# Patient Record
Sex: Female | Born: 1990 | Race: White | Hispanic: No | Smoking: Never smoker
Health system: Southern US, Community
[De-identification: ages and names within clinical notes are randomized; demographics above are authoritative.]

## PROBLEM LIST (undated history)

## (undated) DIAGNOSIS — T7840XA Allergy, unspecified, initial encounter: Secondary | ICD-10-CM

## (undated) DIAGNOSIS — Z973 Presence of spectacles and contact lenses: Secondary | ICD-10-CM

## (undated) DIAGNOSIS — F32A Depression, unspecified: Secondary | ICD-10-CM

## (undated) DIAGNOSIS — F419 Anxiety disorder, unspecified: Secondary | ICD-10-CM

## (undated) DIAGNOSIS — J353 Hypertrophy of tonsils with hypertrophy of adenoids: Secondary | ICD-10-CM

## (undated) HISTORY — DX: Allergy, unspecified, initial encounter: T78.40XA

## (undated) HISTORY — PX: NO PAST SURGERIES: SHX2092

## (undated) HISTORY — PX: OTHER SURGICAL HISTORY: SHX169

## (undated) HISTORY — DX: Anxiety disorder, unspecified: F41.9

## (undated) HISTORY — DX: Depression, unspecified: F32.A

## (undated) HISTORY — PX: WISDOM TOOTH EXTRACTION: SHX21

---
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
## 2012-02-26 ENCOUNTER — Ambulatory Visit: Payer: Self-pay

## 2012-10-08 ENCOUNTER — Ambulatory Visit: Payer: Self-pay | Admitting: Internal Medicine

## 2016-02-03 ENCOUNTER — Encounter: Payer: Self-pay | Admitting: Family Medicine

## 2016-02-03 ENCOUNTER — Ambulatory Visit (INDEPENDENT_AMBULATORY_CARE_PROVIDER_SITE_OTHER): Payer: BLUE CROSS/BLUE SHIELD | Admitting: Family Medicine

## 2016-02-03 VITALS — BP 109/68 | HR 67 | Temp 98.0°F | Resp 16 | Ht 67.0 in | Wt 172.0 lb

## 2016-02-03 DIAGNOSIS — J329 Chronic sinusitis, unspecified: Secondary | ICD-10-CM | POA: Diagnosis not present

## 2016-02-03 DIAGNOSIS — R0982 Postnasal drip: Secondary | ICD-10-CM

## 2016-02-03 DIAGNOSIS — J301 Allergic rhinitis due to pollen: Secondary | ICD-10-CM | POA: Diagnosis not present

## 2016-02-03 DIAGNOSIS — J029 Acute pharyngitis, unspecified: Secondary | ICD-10-CM | POA: Diagnosis not present

## 2016-02-03 LAB — POCT RAPID STREP A (OFFICE): Rapid Strep A Screen: NEGATIVE

## 2016-02-03 MED ORDER — PSEUDOEPHEDRINE HCL 60 MG PO TABS: 60.0000 mg | ORAL_TABLET | Freq: Three times a day (TID) | ORAL | Status: DC | PRN

## 2016-02-03 MED ORDER — LORATADINE 10 MG PO TABS: 10.0000 mg | ORAL_TABLET | Freq: Every day | ORAL | Status: DC

## 2016-02-03 MED ORDER — OXYMETAZOLINE HCL 0.05 % NA SOLN: NASAL | Status: DC

## 2016-02-03 MED ORDER — FLUTICASONE PROPIONATE 50 MCG/ACT NA SUSP
2.0000 | Freq: Every day | NASAL | Status: DC
Start: 2016-02-03 — End: 2016-03-09

## 2016-02-03 NOTE — Progress Notes (Signed)
Subjective:    Patient ID: Karen Sanders, female    DOB: 07/01/1991, 25 y.o.   MRN: [id number]  HPI: Karen Sanders is a 25 y.o. female presenting on 02/03/2016 for Sore Throat and Laryngitis   HPI  Pt presents for sore thorat x 2 weeks. Started with cold symptoms- congestion, sore throat, cough. Still having ear pain, hoarse voice. Lots of sinus drainage. No sinus pain or pressure. Has history of sinus issues. Takes zyrtec daily. Gets strep throat 3 times per year. She is requesting an ENT referral today.   Past Medical History  Diagnosis Date  . Allergy     No current outpatient prescriptions on file prior to visit.   No current facility-administered medications on file prior to visit.    Review of Systems  Constitutional: Negative for fever and chills.  HENT: Positive for congestion, postnasal drip, rhinorrhea, sore throat and voice change. Negative for ear pain, sinus pressure, sneezing and trouble swallowing.   Respiratory: Positive for cough. Negative for chest tightness, shortness of breath and wheezing.   Cardiovascular: Negative for chest pain, palpitations and leg swelling.  Gastrointestinal: Negative.  Negative for nausea, vomiting and diarrhea.  Musculoskeletal: Negative for neck pain and neck stiffness.  Neurological: Negative for headaches.   Per HPI unless specifically indicated above     Objective:    BP 109/68 mmHg  Pulse 67  Temp(Src) 98 F (36.7 C) (Oral)  Resp 16  Ht  (1.702 m)  Wt 172 lb (78.019 kg)  BMI 26.93 kg/m2  SpO2 99%  LMP   Wt Readings from Last 3 Encounters:  02/03/16 172 lb (78.019 kg)    Physical Exam  Constitutional: She appears well-developed and well-nourished. No distress.  HENT:  Head: Normocephalic and atraumatic.  Right Ear: Hearing normal. Tympanic membrane is retracted. Tympanic membrane is not erythematous and not bulging.  Left Ear: Hearing normal. Tympanic membrane is scarred. Tympanic membrane is not  erythematous and not bulging.  Nose: Mucosal edema and rhinorrhea present. No sinus tenderness or nasal septal hematoma. Right sinus exhibits no maxillary sinus tenderness and no frontal sinus tenderness. Left sinus exhibits no maxillary sinus tenderness and no frontal sinus tenderness.  Mouth/Throat: Uvula is midline and mucous membranes are normal. No uvula swelling. Posterior oropharyngeal erythema (mild. No beefy redness or petechaie.) present. No posterior oropharyngeal edema.  Neck: Neck supple. No Brudzinski's sign and no Kernig's sign noted.  Cardiovascular: Normal rate, regular rhythm and normal heart sounds.   Pulmonary/Chest: Breath sounds normal. No accessory muscle usage. No tachypnea. No respiratory distress.  Lymphadenopathy:    She has no cervical adenopathy.   Results for orders placed or performed in visit on 02/03/16  POCT rapid strep A  Result Value Ref Range   Rapid Strep A Screen Negative Negative      Assessment & Plan:   Problem List Items Addressed This Visit    None    Visit Diagnoses    Sore throat    -  Primary    Likely 2/2 nasal drainage. Salt water gargles for comfort. Rapid strep is negative. Culture pending. Referral to ENT placed for frequent strep and sinus issues.    Relevant Orders    POCT rapid strep A (Completed)    Culture, Group A Strep    Post-nasal drainage        Start flonase and claritin. Start sudafed to help with congestion. Afrin as needed for ear congestion.     Relevant  Medications    pseudoephedrine (SUDAFED) 60 MG tablet    oxymetazoline (AFRIN NASAL SPRAY) 0.05 % nasal spray    Other Relevant Orders    Ambulatory referral to ENT    Allergic rhinitis due to pollen        Start flonase daily to help with symptoms. Pt is requesting and ENT referral due to frequent sinus issues.     Relevant Medications    fluticasone (FLONASE) 50 MCG/ACT nasal spray    loratadine (CLARITIN) 10 MG tablet    Other Relevant Orders    Ambulatory  referral to ENT       Meds ordered this encounter  Medications  . fluticasone (FLONASE) 50 MCG/ACT nasal spray    Sig: Place 2 sprays into both nostrils daily.    Dispense:  16 g    Refill:  11    Order Specific Question:  Supervising Provider    Answer:  Janeann Forehand [phone]  . pseudoephedrine (SUDAFED) 60 MG tablet    Sig: Take 1 tablet (60 mg total) by mouth every 8 (eight) hours as needed for congestion.    Dispense:  30 tablet    Refill:  0    Order Specific Question:  Supervising Provider    Answer:  Janeann Forehand [phone]  . oxymetazoline (AFRIN NASAL SPRAY) 0.05 % nasal spray    Sig: Place 2 sprays in the nose at bedtime for 3 days and 3 days only. Lean head back and allow to drip into the ear.    Dispense:  30 mL    Refill:  0    Order Specific Question:  Supervising Provider    Answer:  Janeann Forehand [[id number]]  . loratadine (CLARITIN) 10 MG tablet    Sig: Take 1 tablet (10 mg total) by mouth daily.    Dispense:  30 tablet    Refill:  11    Order Specific Question:  Supervising Provider    Answer:  Janeann Forehand [phone]      Follow up plan: Return if symptoms worsen or fail to improve.

## 2016-02-03 NOTE — Patient Instructions (Signed)
I think your symptoms are related to sinus drainage. Start taking Flonase 2 sprays in the AM daily. For congestion take sudafed at needed. You can also use Afrin at bedtime to help with the ear pain.    Take a claritin or allegra over the counter daily.  We will have you see an ENT to discuss having your tonsils out and your persistent symptoms.

## 2016-02-06 LAB — CULTURE, GROUP A STREP: Strep A Culture: NEGATIVE

## 2016-02-15 ENCOUNTER — Ambulatory Visit: Payer: BLUE CROSS/BLUE SHIELD | Admitting: Family Medicine

## 2016-02-15 ENCOUNTER — Encounter: Payer: Self-pay | Admitting: Family Medicine

## 2016-03-09 ENCOUNTER — Encounter: Payer: Self-pay | Admitting: *Deleted

## 2016-03-14 NOTE — Discharge Instructions (Signed)
T & A INSTRUCTION SHEET - [facility] °[facility] ° °CREIGHTON VAUGHT, MD °PAUL H. JUENGEL, MD  °P. [facility] BENNETT °CHAPMAN MCQUEEN, MD ° °[address] [place], [place] [address] TEL. [phone] °[address] [place] [address] [phone] ° °INFORMATION SHEET FOR A TONSILLECTOMY AND ADENDOIDECTOMY ° °About Your Tonsils and Adenoids ° The tonsils and adenoids are normal body tissues that are part of our immune system.  They normally help to protect us against diseases that may enter our mouth and nose.  However, sometimes the tonsils and/or adenoids become too large and obstruct our breathing, especially at night. °  ° If either of these things happen it helps to remove the tonsils and adenoids in order to become healthier. The operation to remove the tonsils and adenoids is called a tonsillectomy and adenoidectomy. ° °The Location of Your Tonsils and Adenoids ° The tonsils are located in the back of the throat on both side and sit in a cradle of muscles. The adenoids are located in the roof of the mouth, behind the nose, and closely associated with the opening of the Eustachian tube to the ear. ° °Surgery on Tonsils and Adenoids ° A tonsillectomy and adenoidectomy is a short operation which takes about thirty minutes.  This includes being put to sleep and being awakened.  Tonsillectomies and adenoidectomies are performed at [facility] and may require observation period in the recovery room prior to going home. ° °Following the Operation for a Tonsillectomy ° A cautery machine is used to control bleeding.  Bleeding from a tonsillectomy and adenoidectomy is minimal and postoperatively the risk of bleeding is approximately four percent, although this rarely life threatening. ° ° ° °After your tonsillectomy and adenoidectomy post-op care at home: ° °1. Our patients are able to go home the same day.  You may be given prescriptions for pain  medications and antibiotics, if indicated. °2. It is extremely important to remember that fluid intake is of utmost importance after a tonsillectomy.  The amount that you drink must be maintained in the postoperative period.  A good indication of whether a child is getting enough fluid is whether his/her urine output is constant.  As long as children are urinating or wetting their diaper every 6 - 8 hours this is usually enough fluid intake.   °3. Although rare, this is a risk of some bleeding in the first ten days after surgery.  This is usually occurs between day five and nine postoperatively.  This risk of bleeding is approximately four percent.  If you or your child should have any bleeding you should remain calm and notify our office or go directly to the Emergency Room at [facility] where they will contact us. Our doctors are available seven days a week for notification.  We recommend sitting up quietly in a chair, place an ice pack on the front of the neck and spitting out the blood gently until we are able to contact you.  Adults should gargle gently with ice water and this may help stop the bleeding.  If the bleeding does not stop after a short time, i.e. 10 to 15 minutes, or seems to be increasing again, please contact us or go to the hospital.   °4. It is common for the pain to be worse at 5 - 7 days postoperatively.  This occurs because the “scab” is peeling off and the mucous membrane (skin of   the throat) is growing back where the tonsils were.   °5. It is common for a low-grade fever, less than 102, during the first week after a tonsillectomy and adenoidectomy.  It is usually due to not drinking enough liquids, and we suggest your use liquid Tylenol or the pain medicine with Tylenol prescribed in order to keep your temperature below 102.  Please follow the directions on the back of the bottle. °6. Do not take aspirin or any products that contain aspirin such as Bufferin, Anacin,  Ecotrin, aspirin gum, Goodies, BC headache powders, etc., after a T&A because it can promote bleeding.  Please check with our office before administering any other medication that may been prescribed by other doctors during the two week post-operative period. °7. If you happen to look in the mirror or into your child’s mouth you will see white/gray patches on the back of the throat.  This is what a scab looks like in the mouth and is normal after having a T&A.  It will disappear once the tonsil area heals completely. However, it may cause a noticeable odor, and this too will disappear with time.     °8. You or your child may experience ear pain after having a T&A.  This is called referred pain and comes from the throat, but it is felt in the ears.  Ear pain is quite common and expected.  It will usually go away after ten days.  There is usually nothing wrong with the ears, and it is primarily due to the healing area stimulating the nerve to the ear that runs along the side of the throat.  Use either the prescribed pain medicine or Tylenol as needed.  °9. The throat tissues after a tonsillectomy are obviously sensitive.  Smoking around children who have had a tonsillectomy significantly increases the risk of bleeding.  DO NOT SMOKE!  ° °General Anesthesia, Adult, Care After °Refer to this sheet in the next few weeks. These instructions provide you with information on caring for yourself after your procedure. Your health care provider may also give you more specific instructions. Your treatment has been planned according to current medical practices, but problems sometimes occur. Call your health care provider if you have any problems or questions after your procedure. °WHAT TO EXPECT AFTER THE PROCEDURE °After the procedure, it is typical to experience: °· Sleepiness. °· Nausea and vomiting. °HOME CARE INSTRUCTIONS °· For the first 24 hours after general anesthesia: °¨ Have a responsible person with you. °¨ Do not  drive a car. If you are alone, do not take public transportation. °¨ Do not drink alcohol. °¨ Do not take medicine that has not been prescribed by your health care provider. °¨ Do not sign important papers or make important decisions. °¨ You may resume a normal diet and activities as directed by your health care provider. °· Change bandages (dressings) as directed. °· If you have questions or problems that seem related to general anesthesia, call the hospital and ask for the anesthetist or anesthesiologist on call. °SEEK MEDICAL CARE IF: °· You have nausea and vomiting that continue the day after anesthesia. °· You develop a rash. °SEEK IMMEDIATE MEDICAL CARE IF:  °· You have difficulty breathing. °· You have chest pain. °· You have any allergic problems. °  °This information is not intended to replace advice given to you by your health care provider. Make sure you discuss any questions you have with your health care provider. °  °Document   Released: 02/26/2001 Document Revised: 12/11/2014 Document Reviewed: 03/20/2012 °Elsevier Interactive Patient Education ©2016 [employer] ° °

## 2016-03-15 ENCOUNTER — Ambulatory Visit: Payer: BLUE CROSS/BLUE SHIELD | Admitting: Anesthesiology

## 2016-03-15 ENCOUNTER — Ambulatory Visit
Admission: RE | Admit: 2016-03-15 | Discharge: 2016-03-15 | Disposition: A | Discharge status: Home / Self Care | Payer: BLUE CROSS/BLUE SHIELD | Source: Ambulatory Visit | Attending: Otolaryngology | Admitting: Otolaryngology

## 2016-03-15 ENCOUNTER — Encounter
Admission: RE | Disposition: A | Discharge status: Home / Self Care | Payer: Self-pay | Source: Ambulatory Visit | Attending: Otolaryngology

## 2016-03-15 ENCOUNTER — Encounter: Payer: Self-pay | Admitting: Otolaryngology

## 2016-03-15 DIAGNOSIS — Z881 Allergy status to other antibiotic agents status: Secondary | ICD-10-CM | POA: Insufficient documentation

## 2016-03-15 DIAGNOSIS — J3489 Other specified disorders of nose and nasal sinuses: Secondary | ICD-10-CM | POA: Diagnosis not present

## 2016-03-15 DIAGNOSIS — J353 Hypertrophy of tonsils with hypertrophy of adenoids: Secondary | ICD-10-CM | POA: Diagnosis not present

## 2016-03-15 DIAGNOSIS — J351 Hypertrophy of tonsils: Secondary | ICD-10-CM | POA: Diagnosis not present

## 2016-03-15 HISTORY — DX: Presence of spectacles and contact lenses: Z97.3

## 2016-03-15 HISTORY — PX: TONSILLECTOMY AND ADENOIDECTOMY: SHX28

## 2016-03-15 HISTORY — DX: Hypertrophy of tonsils with hypertrophy of adenoids: J35.3

## 2016-03-15 SURGERY — TONSILLECTOMY AND ADENOIDECTOMY
Anesthesia: General | Site: Throat | Wound class: Clean Contaminated

## 2016-03-15 MED ORDER — GLYCOPYRROLATE 0.2 MG/ML IJ SOLN
INTRAMUSCULAR | Status: DC | PRN
  Administered 2016-03-15: 0.1 mg via INTRAVENOUS

## 2016-03-15 MED ORDER — LIDOCAINE VISCOUS 2 % MT SOLN: 10.0000 mL | Freq: Four times a day (QID) | OROMUCOSAL | Status: DC | PRN

## 2016-03-15 MED ORDER — FENTANYL CITRATE (PF) 100 MCG/2ML IJ SOLN
25.0000 ug | INTRAMUSCULAR | Status: DC | PRN
  Administered 2016-03-15: 25 ug via INTRAVENOUS

## 2016-03-15 MED ORDER — DEXAMETHASONE SODIUM PHOSPHATE 4 MG/ML IJ SOLN
INTRAMUSCULAR | Status: DC | PRN
  Administered 2016-03-15: 10 mg via INTRAVENOUS

## 2016-03-15 MED ORDER — PROPOFOL 10 MG/ML IV BOLUS
INTRAVENOUS | Status: DC | PRN
  Administered 2016-03-15: 160 mg via INTRAVENOUS

## 2016-03-15 MED ORDER — LIDOCAINE HCL (CARDIAC) 20 MG/ML IV SOLN
INTRAVENOUS | Status: DC | PRN
  Administered 2016-03-15: 40 mg via INTRAVENOUS

## 2016-03-15 MED ORDER — PROMETHAZINE HCL 12.5 MG PO TABS: 12.5000 mg | ORAL_TABLET | Freq: Four times a day (QID) | ORAL | Status: DC | PRN

## 2016-03-15 MED ORDER — FENTANYL CITRATE (PF) 100 MCG/2ML IJ SOLN
INTRAMUSCULAR | Status: DC | PRN
  Administered 2016-03-15: 100 ug via INTRAVENOUS
  Administered 2016-03-15 (×4): 25 ug via INTRAVENOUS

## 2016-03-15 MED ORDER — FENTANYL CITRATE (PF) 100 MCG/2ML IJ SOLN: 25.0000 ug | INTRAMUSCULAR | Status: DC | PRN

## 2016-03-15 MED ORDER — OXYCODONE HCL 5 MG/5ML PO SOLN: 10.0000 mg | ORAL | Status: DC | PRN

## 2016-03-15 MED ORDER — ACETAMINOPHEN 10 MG/ML IV SOLN
1000.0000 mg | Freq: Once | INTRAVENOUS | Status: AC
  Administered 2016-03-15: 1000 mg via INTRAVENOUS

## 2016-03-15 MED ORDER — ONDANSETRON HCL 4 MG/2ML IJ SOLN: 4.0000 mg | Freq: Once | INTRAMUSCULAR | Status: DC | PRN

## 2016-03-15 MED ORDER — OXYMETAZOLINE HCL 0.05 % NA SOLN
NASAL | Status: DC | PRN
  Administered 2016-03-15: 1 via TOPICAL

## 2016-03-15 MED ORDER — LACTATED RINGERS IV SOLN
INTRAVENOUS | Status: DC
  Administered 2016-03-15: 10 mL/h via INTRAVENOUS

## 2016-03-15 MED ORDER — MIDAZOLAM HCL 5 MG/5ML IJ SOLN
INTRAMUSCULAR | Status: DC | PRN
  Administered 2016-03-15: 2 mg via INTRAVENOUS

## 2016-03-15 MED ORDER — OXYCODONE HCL 5 MG/5ML PO SOLN
5.0000 mg | Freq: Once | ORAL | Status: AC
  Administered 2016-03-15: 5 mg via ORAL

## 2016-03-15 MED ORDER — BUPIVACAINE HCL (PF) 0.25 % IJ SOLN
INTRAMUSCULAR | Status: DC | PRN
  Administered 2016-03-15: 2 mL

## 2016-03-15 MED ORDER — ONDANSETRON HCL 4 MG/2ML IJ SOLN
INTRAMUSCULAR | Status: DC | PRN
  Administered 2016-03-15: 4 mg via INTRAVENOUS

## 2016-03-15 MED ORDER — SUCCINYLCHOLINE CHLORIDE 20 MG/ML IJ SOLN
INTRAMUSCULAR | Status: DC | PRN
  Administered 2016-03-15: 100 mg via INTRAVENOUS

## 2016-03-15 SURGICAL SUPPLY — 17 items
BLADE BOVIE TIP EXT 4 (BLADE) ×2 IMPLANT
CANISTER SUCT 1200ML W/VALVE (MISCELLANEOUS) ×2 IMPLANT
CATH ROBINSON RED A/P 10FR (CATHETERS) ×2 IMPLANT
COAG SUCT 10F 3.5MM HAND CTRL (MISCELLANEOUS) ×2 IMPLANT
GLOVE BIO SURGEON STRL SZ7.5 (GLOVE) ×2 IMPLANT
HANDLE SUCTION POOLE (INSTRUMENTS) ×1 IMPLANT
KIT ROOM TURNOVER OR (KITS) ×2 IMPLANT
NEEDLE HYPO 25GX1X1/2 BEV (NEEDLE) ×2 IMPLANT
NS IRRIG 500ML POUR BTL (IV SOLUTION) ×2 IMPLANT
PACK TONSIL/ADENOIDS (PACKS) ×2 IMPLANT
PAD GROUND ADULT SPLIT (MISCELLANEOUS) ×2 IMPLANT
PENCIL ELECTRO HAND CTR (MISCELLANEOUS) ×2 IMPLANT
SOL ANTI-FOG 6CC FOG-OUT (MISCELLANEOUS) ×1 IMPLANT
SOL FOG-OUT ANTI-FOG 6CC (MISCELLANEOUS) ×1
STRAP BODY AND KNEE 60X3 (MISCELLANEOUS) ×2 IMPLANT
SUCTION POOLE HANDLE (INSTRUMENTS) ×2
SYR 5ML LL (SYRINGE) ×2 IMPLANT

## 2016-03-15 NOTE — Anesthesia Procedure Notes (Signed)
Procedure Name: Intubation Date/Time: 03/15/2016 9:36 AM Performed by: Jimmy PicketAMYOT, Jaylean Buenaventura Pre-anesthesia Checklist: Patient identified, Emergency Drugs available, Suction available, Patient being monitored and Timeout performed Patient Re-evaluated:Patient Re-evaluated prior to inductionOxygen Delivery Method: Circle system utilized Preoxygenation: Pre-oxygenation with 100% oxygen Intubation Type: IV induction Ventilation: Mask ventilation without difficulty Laryngoscope Size: Miller and 2 Grade View: Grade I Tube type: Oral Rae Tube size: 7.0 mm Number of attempts: 1 Placement Confirmation: ETT inserted through vocal cords under direct vision,  positive ETCO2 and breath sounds checked- equal and bilateral Tube secured with: Tape Dental Injury: Teeth and Oropharynx as per pre-operative assessment

## 2016-03-15 NOTE — H&P (Signed)
..  History and Physical paper copy reviewed and updated date of procedure and will be scanned into system.  

## 2016-03-15 NOTE — Transfer of Care (Signed)
Immediate Anesthesia Transfer of Care Note  Patient: Karen Sanders  Procedure(s) Performed: Procedure(s): TONSILLECTOMY AND ADENOIDECTOMY (N/A)  Patient Location: PACU  Anesthesia Type: General ETT  Level of Consciousness: awake, alert  and patient cooperative  Airway and Oxygen Therapy: Patient Spontanous Breathing and Patient connected to supplemental oxygen  Post-op Assessment: Post-op Vital signs reviewed, Patient's Cardiovascular Status Stable, Respiratory Function Stable, Patent Airway and No signs of Nausea or vomiting  Post-op Vital Signs: Reviewed and stable  Complications: No apparent anesthesia complications  

## 2016-03-15 NOTE — Anesthesia Postprocedure Evaluation (Signed)
Anesthesia Post Note  Patient: Karen Sanders  Procedure(s) Performed: Procedure(s) (LRB): TONSILLECTOMY AND ADENOIDECTOMY (N/A)  Patient location during evaluation: PACU Anesthesia Type: General Level of consciousness: awake and alert and oriented Pain management: satisfactory to patient Vital Signs Assessment: post-procedure vital signs reviewed and stable Respiratory status: spontaneous breathing, nonlabored ventilation and respiratory function stable Cardiovascular status: blood pressure returned to baseline and stable Postop Assessment: Adequate PO intake and No signs of nausea or vomiting Anesthetic complications: no    Cherly BeachStella, Shalandria Elsbernd J

## 2016-03-15 NOTE — Transfer of Care (Deleted)
Immediate Anesthesia Transfer of Care Note  Patient: Karen Sanders  Procedure(s) Performed: Procedure(s): TONSILLECTOMY AND ADENOIDECTOMY (N/A)  Patient Location: PACU  Anesthesia Type: General ETT  Level of Consciousness: awake, alert  and patient cooperative  Airway and Oxygen Therapy: Patient Spontanous Breathing and Patient connected to supplemental oxygen  Post-op Assessment: Post-op Vital signs reviewed, Patient's Cardiovascular Status Stable, Respiratory Function Stable, Patent Airway and No signs of Nausea or vomiting  Post-op Vital Signs: Reviewed and stable  Complications: No apparent anesthesia complications

## 2016-03-15 NOTE — Anesthesia Preprocedure Evaluation (Addendum)
Anesthesia Evaluation  Patient identified by MRN, date of birth, ID band  Reviewed: Allergy & Precautions, H&P , NPO status , Patient's Chart, lab work & pertinent test results  Airway Mallampati: I  TM Distance: >3 FB Neck ROM: full    Dental no notable dental hx.    Pulmonary    Pulmonary exam normal        Cardiovascular  Rhythm:regular Rate:Normal     Neuro/Psych    GI/Hepatic   Endo/Other    Renal/GU      Musculoskeletal   Abdominal   Peds  Hematology   Anesthesia Other Findings   Reproductive/Obstetrics                             Anesthesia Physical Anesthesia Plan  ASA: I  Anesthesia Plan: General ETT   Post-op Pain Management:    Induction:   Airway Management Planned:   Additional Equipment:   Intra-op Plan:   Post-operative Plan:   Informed Consent: I have reviewed the patients History and Physical, chart, labs and discussed the procedure including the risks, benefits and alternatives for the proposed anesthesia with the patient or authorized representative who has indicated his/her understanding and acceptance.     Plan Discussed with: CRNA  Anesthesia Plan Comments:         Anesthesia Quick Evaluation  

## 2016-03-15 NOTE — Op Note (Signed)
..  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  General endotracheal anesthesia was induced in the normal fashion.  At this time, the patient was rotated 45 degrees and a shoulder roll was placed.  At this time, a McIvor mouthgag was inserted into the patient's oral cavity and suspended from the Mayo stand without injury to teeth, lips, or gums.  Next a red rubber catheter was inserted into the patient left nostril for retraction of the uvula and soft palate superiorly.  Next a curved Alice clamp was attached to the patient's right superior tonsillar pole and retracted medially and inferiorly.  A Bovie electrocautery was used to dissect the patient's right tonsil in a subcapsular plane.  Meticulous hemostasis was achieved with Bovie suction cautery.  At this time, the mouth gag was released from suspension for 1 minute.  Attention now was directed to the patient's left side.  In a similar fashion the curved Alice clamp was attached to the superior pole and this was retracted medially and inferiorly and the tonsil was excised in a subcapsular plane with Bovie electrocautery.  After completion of the second tonsil, meticulous hemostasis was continued.  At this time, attention was directed to the patient's Adenoidectomy.  Under indirect visualization using an operating mirror, the adenoid tissue was visualized and noted to be obstructive in nature.  Using a [place]  Claire forceps, the adenoid tissue was de bulked and debrided for a widely patent choana.  Folling debulking, the remaining adenoid tissue was ablated and desiccated with Bovie suction cautery.  Meticulous hemostasis was continued.  At this time, the patient's nasal cavity and oral cavity was irrigated with sterile saline.  2cc of 0.5% Marcaine was injected into the anterior and posterior tonsillar fossa bilaterally.  Following this  The care of patient was returned to anesthesia, awakened, and transferred to recovery in stable condition.  Dispo:  PACU to home  Plan: Soft diet.  Limit exercise and strenuous activity for 2 weeks.  Fluid hydration  Recheck my office three weeks.   Mikenzi Raysor 10:08 AM 03/15/2016

## 2016-03-16 ENCOUNTER — Encounter: Payer: Self-pay | Admitting: Otolaryngology

## 2016-03-17 LAB — SURGICAL PATHOLOGY

## 2016-10-09 ENCOUNTER — Ambulatory Visit
Admission: EM | Admit: 2016-10-09 | Discharge: 2016-10-09 | Disposition: A | Discharge status: Home / Self Care | Payer: BLUE CROSS/BLUE SHIELD | Attending: Family Medicine | Admitting: Family Medicine

## 2016-10-09 DIAGNOSIS — J069 Acute upper respiratory infection, unspecified: Secondary | ICD-10-CM | POA: Diagnosis not present

## 2016-10-09 LAB — RAPID STREP SCREEN (MED CTR MEBANE ONLY): Streptococcus, Group A Screen (Direct): NEGATIVE

## 2016-10-09 MED ORDER — FLUTICASONE PROPIONATE 50 MCG/ACT NA SUSP: 2.0000 | Freq: Every day | NASAL | 0 refills | Status: DC

## 2016-10-09 MED ORDER — CETIRIZINE-PSEUDOEPHEDRINE ER 5-120 MG PO TB12
1.0000 | ORAL_TABLET | Freq: Two times a day (BID) | ORAL | 0 refills | Status: DC
Start: 2016-10-09 — End: 2017-05-04

## 2016-10-09 NOTE — ED Provider Notes (Signed)
CSN: [id number]     Arrival date & time 10/09/16  0857 History   First MD Initiated Contact with Patient 10/09/16 1035     Chief Complaint  Patient presents with  . Fatigue   (Consider location/radiation/quality/duration/timing/severity/associated sxs/prior Treatment) HPI  This a 25 year old female who presents with the sudden onset of fatigue sore throat chills fever to 100.4 and body aches. So during the earlier part of the day she was feeling fine but around noon time began to have the sudden onset of the above symptoms. Since that time she states that today she is more congested than anything having a greenish discharge from her nose. Not had any coughing to speak of. She is afebrile today but took Tylenol Prior to Her Visit. She Did Not Have a Flu Shot This Year so Far.      Past Medical History:  Diagnosis Date  . Allergy   . Hypertrophy of tonsils and adenoids   . Wears contact lenses    Past Surgical History:  Procedure Laterality Date  . NO PAST SURGERIES    . TONSILLECTOMY AND ADENOIDECTOMY N/A 03/15/2016   Procedure: TONSILLECTOMY AND ADENOIDECTOMY;  Surgeon: Bud Facereighton Vaught, MD;  [facility];  Service: ENT;  Laterality: N/A;   Family History  Problem Relation Age of Onset  . Heart disease Mother    Social History  Substance Use Topics  . Smoking status: Never Smoker  . Smokeless tobacco: Never Used  . Alcohol use No   OB History    No data available     Review of Systems  Constitutional: Positive for activity change, fatigue and fever. Negative for appetite change and chills.  HENT: Positive for congestion, postnasal drip, rhinorrhea, sinus pressure and sore throat.   Respiratory: Negative for cough, shortness of breath, wheezing and stridor.   All other systems reviewed and are negative.   Allergies  Amoxicillin  Home Medications   Prior to Admission medications   Medication Sig Start Date End Date Taking? Authorizing Provider   cetirizine-pseudoephedrine (ZYRTEC-D) 5-120 MG tablet Take 1 tablet by mouth 2 (two) times daily. 10/09/16   Lutricia FeilWilliam P Carmel Waddington, PA-C  fluticasone (FLONASE) 50 MCG/ACT nasal spray Place 2 sprays into both nostrils daily. 10/09/16   Lutricia FeilWilliam P Saliha Salts, PA-C   Meds Ordered and Administered this Visit  Medications - No data to display  BP 94/71 (BP Location: Left Arm)   Pulse (!) 103   Temp 98.5 F (36.9 C) (Oral)   Resp 16   Ht 5\' 5"  (1.651 m)   Wt 180 lb (81.6 kg)   SpO2 98%   BMI 29.95 kg/m  No data found.   Physical Exam  Constitutional: She is oriented to person, place, and time. She appears well-developed and well-nourished. No distress.  HENT:  Head: Normocephalic and atraumatic.  Right Ear: External ear normal.  Mouth/Throat: Oropharynx is clear and moist.  Left ear has sclerosis at approximately the 5:00 to 7:00 position. nostril, mucosa is boggy bilaterally.  Eyes: EOM are normal. Pupils are equal, round, and reactive to light. Right eye exhibits no discharge. Left eye exhibits no discharge.  Neck: Normal range of motion. Neck supple.  Pulmonary/Chest: Effort normal and breath sounds normal. No respiratory distress. She has no wheezes. She has no rales.  Musculoskeletal: Normal range of motion.  Neurological: She is alert and oriented to person, place, and time.  Skin: Skin is warm and dry. She is not diaphoretic.  Psychiatric: She has a normal  mood and affect. Her behavior is normal. Judgment and thought content normal.  Nursing note and vitals reviewed.   Urgent Care Course   Clinical Course     Procedures (including critical care time)  Labs Review Labs Reviewed  RAPID STREP SCREEN (NOT AT [facility])  CULTURE, GROUP A STREP [facility])    Imaging Review No results found.   Visual Acuity Review  Right Eye Distance:   Left Eye Distance:   Bilateral Distance:    Right Eye Near:   Left Eye Near:    Bilateral Near:         MDM   1. URI, acute    Discharge  Medication List as of 10/09/2016 10:35 AM    START taking these medications   Details  cetirizine-pseudoephedrine (ZYRTEC-D) 5-120 MG tablet Take 1 tablet by mouth 2 (two) times daily., Starting Mon 10/09/2016, Normal    fluticasone (FLONASE) 50 MCG/ACT nasal spray Place 2 sprays into both nostrils daily., Starting Mon 10/09/2016, Normal      Plan: 1. Test/x-ray results and diagnosis reviewed with patient 2. rx as per orders; risks, benefits, potential side effects reviewed with patient 3. Recommend supportive treatment with Rest and fluid. His Tylenol or Motrin for fatigue and fever control. If she is not improving in a week or 2 she should return to the clinic. This is most likely a viral infection and does not require antibiotic therapy. Cultures and sensitivities from the pharyngeal swab will be available 48 hours and we will treat her according to the results.  4. F/u prn if symptoms worsen or don't improve     Lutricia FeilWilliam P Ether Wolters, PA-C 10/09/16 1043

## 2016-10-09 NOTE — ED Triage Notes (Signed)
Patient complains of fatigue, sore throat, chills, fever, body aches that started suddenly yesterday. Patient states that she felt fine before 12pm yesterday.

## 2016-10-10 LAB — CULTURE, GROUP A STREP (THRC)

## 2016-10-11 ENCOUNTER — Telehealth: Payer: Self-pay | Admitting: Emergency Medicine

## 2016-10-11 MED ORDER — AZITHROMYCIN 250 MG PO TABS: ORAL_TABLET | ORAL | 0 refills | Status: DC

## 2016-10-11 NOTE — Telephone Encounter (Signed)
Patient notified that her throat culture result came back positive for strep.  Patient notified that a [facility] [place] for [place] to start today.  Patient verbalized understanding.

## 2016-11-20 DIAGNOSIS — J029 Acute pharyngitis, unspecified: Secondary | ICD-10-CM | POA: Diagnosis not present

## 2017-01-16 DIAGNOSIS — L853 Xerosis cutis: Secondary | ICD-10-CM | POA: Diagnosis not present

## 2017-05-04 ENCOUNTER — Ambulatory Visit (INDEPENDENT_AMBULATORY_CARE_PROVIDER_SITE_OTHER): Payer: BLUE CROSS/BLUE SHIELD | Admitting: Certified Nurse Midwife

## 2017-05-04 ENCOUNTER — Encounter: Payer: Self-pay | Admitting: Certified Nurse Midwife

## 2017-05-04 VITALS — BP 121/82 | HR 70 | Ht 65.0 in | Wt 181.5 lb

## 2017-05-04 DIAGNOSIS — R102 Pelvic and perineal pain: Secondary | ICD-10-CM

## 2017-05-04 NOTE — Patient Instructions (Signed)

## 2017-05-04 NOTE — Progress Notes (Signed)
GYN ENCOUNTER NOTE  Subjective:       Karen Sanders is a 26 y.o. G0P0000 female is here for gynecologic evaluation of the following issues:  1. Vaginal  odor with menstration, pain with intercourse and irregular bleeding.  She states that her bleeding became more irregular in January. She has bleeding monthly that spans from 3 days duration to 3 weeks with the amount decreasing to spotting. She states that she has an odor only with her menses and that the blood is "fleshy" with possible tissue present. She also states that she has pain with intercourse. Nothing makes the pain better or worse but she has been avoiding intercourse due to pain.  She denies burning , itching , or fever. She is currently sexually active with one female partner for the past 3 yrs. She uses condoms every time because she "does not want any kids".    Gynecologic History Patient's last menstrual period was 04/23/2017 (exact date). Contraception: Nexplanon Last Pap: not had one  Last mammogram: N/A  Obstetric History OB History  Gravida Para Term Preterm AB Living  0 0 0 0 0 0  SAB TAB Ectopic Multiple Live Births  0 0 0 0 0        Past Medical History:  Diagnosis Date  . Allergy   . Hypertrophy of tonsils and adenoids   . Wears contact lenses     Past Surgical History:  Procedure Laterality Date  . NO PAST SURGERIES    . TONSILLECTOMY AND ADENOIDECTOMY N/A 03/15/2016   Procedure: TONSILLECTOMY AND ADENOIDECTOMY;  Surgeon: Bud Facereighton Vaught, MD;  [facility];  Service: ENT;  Laterality: N/A;    No current outpatient prescriptions on file prior to visit.   No current facility-administered medications on file prior to visit.     Allergies  Allergen Reactions  . Amoxicillin Swelling    Social History   Social History  . Marital status: Single    Spouse name: N/A  . Number of children: N/A  . Years of education: N/A   Occupational History  . Not on file.   Social History  Main Topics  . Smoking status: Never Smoker  . Smokeless tobacco: Never Used  . Alcohol use No  . Drug use: No  . Sexual activity: Yes    Birth control/ protection: Implant, Condom   Other Topics Concern  . Not on file   Social History Narrative  . No narrative on file    Family History  Problem Relation Age of Onset  . Heart disease Mother   . Breast cancer Neg Hx   . Ovarian cancer Neg Hx   . Colon cancer Neg Hx   . Diabetes Neg Hx     The following portions of the patient's history were reviewed and updated as appropriate: allergies, current medications, past family history, past medical history, past social history, past surgical history and problem list.  Review of Systems Review of Systems - General ROS: negative for - chills, fatigue, fever, hot flashes, malaise or night sweats Hematological and Lymphatic ROS: negative for - bleeding problems or swollen lymph nodes Gastrointestinal ROS: negative for - abdominal pain, blood in stools, change in bowel habits and nausea/vomiting Musculoskeletal ROS: negative for - joint pain, muscle pain or muscular weakness Genito-Urinary ROS: negative for -  Dysmenorrhea, dysuria, genital discharge, genital ulcers, hematuria, incontinence nocturia or pelvic pain. Positive : dyspareunia, irregular/heavy menses, painful intercourse 5/10 on pain scale  Objective:   BP  121/82   Pulse 70   Ht 5\' 5"  (1.651 m)   Wt 181 lb 8 oz (82.3 kg)   LMP 04/23/2017 (Exact Date)   BMI 30.20 kg/m  CONSTITUTIONAL: Well-developed, well-nourished female in no acute distress.  HENT:  Normocephalic, atraumatic.  NECK: Normal range of motion, supple  SKIN: Skin is warm and dry. No rash noted. Not diaphoretic. No erythema. No pallor. NEUROLGIC: Alert and oriented to person, place, and time.  PSYCHIATRIC: Normal mood and affect. Normal behavior. Normal judgment and thought content. CARDIOVASCULAR:Not Examined RESPIRATORY: Not Examined BREASTS: Not  Examined ABDOMEN: Soft, non distended; Non tender. PELVIC:  External Genitalia: Normal  BUS: Normal  Vagina: Normal, white frothy discharge noted, no odor pesent  Cervix: Normal  Uterus: Normal size, shape,consistency, mobile  Adnexa: Normal  RV: Normal   Bladder: Nontender MUSCULOSKELETAL: Normal range of motion. No tenderness.  No cyanosis, clubbing, or edema.  Assessment:   1.Vaginal odor  - NuSwab Vaginitis Plus (VG+) 2. Painful intercourse 3. Irregular peroids     Plan:   NuSwab , transvaginal ultrasound. Discussed possible causes of pain during intercourse. Pt states that the pain is similar to the feeling she had with palpation of the cervix during exam. Encouraged pt to try different sexual positions. Pain possibly due to cervical stimulation. Will follow up with results of testing. Nexplanon side effect reviewed regarding irregular bleeding. She is due to have it removed in September. Discussed BC options. She is interested in having the Mirena IUD. Reviewed risks, benefits, and procedure placement. We developed a plan of care. Encouraged her to come in for an annual exam to have a pap smear completed at her earliest convienience.  I attest that more than 50% of this visit was spent reviewing patient history, discussing plan of care, treatment options, and  review of birth control methods.   Doreene Burke, CNM

## 2017-05-08 ENCOUNTER — Encounter: Payer: Self-pay | Admitting: Certified Nurse Midwife

## 2017-05-08 ENCOUNTER — Other Ambulatory Visit: Payer: Self-pay | Admitting: Certified Nurse Midwife

## 2017-05-08 LAB — NUSWAB VAGINITIS PLUS (VG+)
Atopobium vaginae: HIGH Score — AB
BVAB 2: HIGH Score — AB
Candida albicans, NAA: POSITIVE — AB
Candida glabrata, NAA: NEGATIVE
Chlamydia trachomatis, NAA: NEGATIVE
Megasphaera 1: HIGH Score — AB
Neisseria gonorrhoeae, NAA: NEGATIVE
Trich vag by NAA: NEGATIVE

## 2017-05-08 MED ORDER — METRONIDAZOLE 500 MG PO TABS: 500.0000 mg | ORAL_TABLET | Freq: Two times a day (BID) | ORAL | 0 refills | Status: AC

## 2017-05-08 MED ORDER — TIOCONAZOLE 6.5 % VA OINT: 1.0000 | TOPICAL_OINTMENT | Freq: Once | VAGINAL | 0 refills | Status: AC

## 2017-05-08 NOTE — Progress Notes (Signed)
Nuswab results positive for BV and yeast. Pt notified via my chart.  Doreene BurkeAnnie Dax Murguia, CNM

## 2017-05-24 ENCOUNTER — Other Ambulatory Visit: Payer: Self-pay | Admitting: Certified Nurse Midwife

## 2017-05-24 DIAGNOSIS — N939 Abnormal uterine and vaginal bleeding, unspecified: Secondary | ICD-10-CM

## 2017-06-04 ENCOUNTER — Ambulatory Visit (INDEPENDENT_AMBULATORY_CARE_PROVIDER_SITE_OTHER): Payer: BLUE CROSS/BLUE SHIELD

## 2017-06-04 DIAGNOSIS — N939 Abnormal uterine and vaginal bleeding, unspecified: Secondary | ICD-10-CM

## 2017-06-15 ENCOUNTER — Telehealth: Payer: Self-pay | Admitting: Certified Nurse Midwife

## 2017-06-15 NOTE — Telephone Encounter (Signed)
Patient called wanting to speak with Pattricia Bossnnie, wanting to know her U/S results and what may have been seen. Patient would like to also know what she needs to do as far as moving forward with scheduling. Please advise.

## 2017-06-29 ENCOUNTER — Encounter: Payer: BLUE CROSS/BLUE SHIELD | Admitting: Certified Nurse Midwife

## 2017-07-13 ENCOUNTER — Encounter: Payer: Self-pay | Admitting: Certified Nurse Midwife

## 2017-07-13 ENCOUNTER — Ambulatory Visit (INDEPENDENT_AMBULATORY_CARE_PROVIDER_SITE_OTHER): Payer: BLUE CROSS/BLUE SHIELD | Admitting: Certified Nurse Midwife

## 2017-07-13 VITALS — BP 104/72 | HR 75 | Ht 64.0 in | Wt 184.2 lb

## 2017-07-13 DIAGNOSIS — Z0000 Encounter for general adult medical examination without abnormal findings: Secondary | ICD-10-CM

## 2017-07-13 NOTE — Progress Notes (Signed)
Pt was scheduled for a pap smear today but started her period today. States it is a normal flow.

## 2017-07-24 DIAGNOSIS — L298 Other pruritus: Secondary | ICD-10-CM | POA: Diagnosis not present

## 2017-08-03 ENCOUNTER — Ambulatory Visit (INDEPENDENT_AMBULATORY_CARE_PROVIDER_SITE_OTHER): Payer: BLUE CROSS/BLUE SHIELD | Admitting: Nurse Practitioner

## 2017-08-03 ENCOUNTER — Encounter: Payer: Self-pay | Admitting: Nurse Practitioner

## 2017-08-03 VITALS — BP 106/71 | HR 81 | Temp 98.3°F | Ht 62.5 in | Wt 187.6 lb

## 2017-08-03 DIAGNOSIS — Z23 Encounter for immunization: Secondary | ICD-10-CM | POA: Diagnosis not present

## 2017-08-03 DIAGNOSIS — Z7189 Other specified counseling: Secondary | ICD-10-CM | POA: Diagnosis not present

## 2017-08-03 DIAGNOSIS — Z7185 Encounter for immunization safety counseling: Secondary | ICD-10-CM

## 2017-08-03 MED ORDER — VARICELLA VIRUS VACCINE LIVE 1350 PFU/0.5ML IJ SUSR: 0.5000 mL | Freq: Once | INTRAMUSCULAR | 0 refills | Status: AC

## 2017-08-03 MED ORDER — TETANUS-DIPHTH-ACELL PERTUSSIS 5-2.5-18.5 LF-MCG/0.5 IM SUSP: 0.5000 mL | Freq: Once | INTRAMUSCULAR | Status: DC

## 2017-08-03 NOTE — Progress Notes (Signed)
Subjective:    Patient ID: Karen Sanders, female    DOB: [birth year], 26 y.o.   MRN: [id number]  Karen Sanders is a 26 y.o. 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  States is fearful of needles and wants to spread them out.  Pt presents requirements.  Review of NCIR shows pt will need: NEEDS tetanus: Last DTP/aP in 2009  NEEDS varicella: only 1 of 2 administered last dose  NEEDS influenza: 04/07/1998   Pt requests general counseling for hepatitis A vaccine and HPV vaccines.  Pt has had previous sexual partners and has already had potential exposure to HPV virus.  Social History  Substance Use Topics  . Smoking status: Never Smoker  . Smokeless tobacco: Never Used  . Alcohol use No    Review of Systems Per HPI unless specifically indicated above     Objective:    BP 106/71 (BP Location: Right Arm, Patient Position: Sitting, Cuff Size: Normal)   Pulse 81   Temp 98.3 F (36.8 C) (Oral)   Ht 5' 2.5" (1.588 m)   Wt 187 lb 9.6 oz (85.1 kg)   LMP 07/13/2017 (Exact Date)   BMI 33.77 kg/m   Wt Readings from Last 3 Encounters:  08/03/17 187 lb 9.6 oz (85.1 kg)  07/13/17 184 lb 3 oz (83.5 kg)  05/04/17 181 lb 8 oz (82.3 kg)    Physical Exam  Constitutional: She is oriented to person, place, and time. She appears well-developed and well-nourished.  HENT:  Head: Normocephalic and atraumatic.  Cardiovascular: Normal rate, regular rhythm and normal heart sounds.   Pulmonary/Chest: Effort normal and breath sounds normal.  Neurological: She is alert and oriented to person, place, and time.  Skin: Skin is warm and dry.  Psychiatric: She has a normal mood and affect. Her behavior is normal. Judgment and thought content normal.  Vitals reviewed.     Results for orders placed or performed  in visit on 05/04/17  NuSwab Vaginitis Plus (VG+)  Result Value Ref Range   Atopobium vaginae High - 2 (A) Score   BVAB 2 High - 2 (A) Score   Megasphaera 1 High - 2 (A) Score   Candida albicans, NAA Positive (A) Negative   Candida glabrata, NAA Negative Negative   Trich vag by NAA Negative Negative   Chlamydia trachomatis, NAA Negative Negative   Neisseria gonorrhoeae, NAA Negative Negative      Assessment & Plan:   Problem List Items Addressed This Visit    None    Visit Diagnoses    Vaccine counseling    -  Primary Review of NCIR reveals deficits in HPV, Hep A, varicella vaccines as recommended.  Pt has questions about need for HPV and Hep A.  Plan: 1. Discussed diseases, exposures, history of possible prior exposure, utility of vaccination, risk of disease.  - pt decides to decline hepatitis A r/t low risk for exposure  - pt decides to decline HPV for now r/t prior sexual encounters w/ possible transmission and no utility of vaccination if prior exposure.  Discussed cervical cancer risk, screening tests and intervals. Will discuss vaccination further with OBGYN at appointment in about 3 weeks. 2. Needs ppd prior to clinical which is > 1 year away - defer today.    Need for diphtheria-tetanus-pertussis (Tdap) vaccine  Due 2009.  Administer today.   Relevant Orders   Tdap vaccine greater than or equal to 7yo IM (Completed)   Influenza vaccine needed     Administer today.   Relevant Orders   Flu Vaccine QUAD 6+ mos PF IM (Fluarix Quad PF) (Completed)   Need for varicella vaccine     Varicella - needs 1 more vaccine for total of 2.  Pt declines titer today.  Plan: 1. Rx sent to commercial pharmacy.  Pt can pick up and bring immediately to clinic for administration or have administered at pharmacy.   Relevant Medications   varicella virus vaccine live (VARIVAX) 1350 PFU/0.5ML INJ injection      Meds ordered this encounter  Medications  . DISCONTD: Tdap (BOOSTRIX)  injection 0.5 mL  . varicella virus vaccine live (VARIVAX) 1350 PFU/0.5ML INJ injection    Sig: Inject 0.5 mLs into the skin once.    Dispense:  0.5 mL    Refill:  0      Follow up plan: Return in about 1 week (around 08/10/2017) for varicella administration w/ CMA.  A total of 20 minutes was spent face-to-face with this patient. Greater than 50% of this time was spent in counseling and coordination of care with the patient about vaccines and general prevention of disease as above.   Wilhelmina Mcardle, DNP, AGPCNP-BC Adult Gerontology Primary Care Nurse Practitioner [facility] 08/03/2017, 3:53 PM

## 2017-08-03 NOTE — Progress Notes (Signed)
I have reviewed this encounter including the documentation in this note and/or discussed this patient with the provider, Wilhelmina McardleLauren Kennedy, AGPCNP-BC. I am certifying that I agree with the content of this note as supervising physician.  Saralyn PilarAlexander Maikel Neisler, DO [facility] 08/03/2017, 8:44 PM

## 2017-08-03 NOTE — Patient Instructions (Addendum)
Lory, Thank you for coming in to clinic today.  1. 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  2. Varicella: order sent your pharmacy [facility] [address] [place].  Pick it up on your way to the clinic for it to be given.  3. Flu shot and Tetanus shot today.   Please schedule a follow-up appointment. Return in about 1 week (around 08/10/2017) for varicella administration w/ CMA.  Pick up from pharmacy on your way to clinic.  If you have any other questions or concerns, please feel free to call the clinic or send a message through MyChart. You may also schedule an earlier appointment if necessary.  You will receive a survey after today's visit either digitally by e-mail or paper by [employer] mail. Your experiences and feedback matter to us.  Please respond so we know how we are doing as we provide care for you.   Wilhelmina McardleLauren Tyeisha Dinan, DNP, AGNP-BC Adult Gerontology Nurse Practitioner [facility], [facility]

## 2017-08-10 ENCOUNTER — Other Ambulatory Visit: Payer: Self-pay

## 2017-08-10 ENCOUNTER — Ambulatory Visit: Payer: BLUE CROSS/BLUE SHIELD

## 2017-08-10 DIAGNOSIS — Z23 Encounter for immunization: Secondary | ICD-10-CM

## 2017-08-10 MED ORDER — VARICELLA VIRUS VACCINE LIVE 1350 PFU/0.5ML IJ SUSR: 0.5000 mL | Freq: Once | INTRAMUSCULAR | 0 refills | Status: AC

## 2017-08-17 ENCOUNTER — Encounter: Payer: BLUE CROSS/BLUE SHIELD | Admitting: Certified Nurse Midwife

## 2017-09-07 ENCOUNTER — Ambulatory Visit (INDEPENDENT_AMBULATORY_CARE_PROVIDER_SITE_OTHER): Payer: BLUE CROSS/BLUE SHIELD | Admitting: Certified Nurse Midwife

## 2017-09-07 ENCOUNTER — Encounter: Payer: Self-pay | Admitting: Certified Nurse Midwife

## 2017-09-07 VITALS — BP 108/79 | HR 77 | Wt 185.5 lb

## 2017-09-07 DIAGNOSIS — Z3046 Encounter for surveillance of implantable subdermal contraceptive: Secondary | ICD-10-CM

## 2017-09-07 DIAGNOSIS — Z30017 Encounter for initial prescription of implantable subdermal contraceptive: Secondary | ICD-10-CM | POA: Diagnosis not present

## 2017-09-07 NOTE — Patient Instructions (Signed)
Nexplanon Instructions After Insertion  Keep bandage clean and dry for 24 hours  May use ice/Tylenol/Ibuprofen for soreness or pain  If you develop fever, drainage or increased warmth from incision site-contact office immediately   

## 2017-09-07 NOTE — Progress Notes (Signed)
Karen Sanders is a 26 y.o. year old G63P0000 Caucasian female here for Nexplanon removal and reinsertion.  She was given informed consent for removal and reinsertion of her Nexplanon. Her Nexplanon was placed 3 yrs ago, Patient's last menstrual period was 08/27/2017 (exact date)., and her pregnancy test was not indicated.   Risks/benefits/side effects of Nexplanon have been discussed and her questions have been answered.  Specifically, a failure rate of 12/998 has been reported, with an increased failure rate if pt takes St. John's Wort and/or antiseizure medicaitons.  [name] is aware of the common side effect of irregular bleeding, which the incidence of decreases over time.  BP 108/79   Pulse 77   Wt 185 lb 8 oz (84.1 kg)   LMP 08/27/2017 (Exact Date)   BMI 33.39 kg/m  Patient's last menstrual period was 08/27/2017 (exact date). No results found for this or any previous visit (from the past 24 hour(s)).   Appropriate time out taken. Nexplanon site identified.  Area prepped in usual sterile fashon. Two cc's of 2% lidocaine was used to anesthetize the area. A small stab incision was made right beside the implant on the distal portion.  The Nexplanon rod was grasped using hemostats and removed intact without difficulty.  The area was cleansed again with betadine and the Nexplanon was inserted per manufacturer's recommendations without difficulty.  Steri-strips and a pressure bandage was applied.  There was less than 3 cc blood loss. There were no complications.  The patient tolerated the procedure well.  She was instructed to keep the area clean and dry, remove pressure bandage in 24 hours, and keep insertion site covered with the steri-strips for 3-5 days.  She was given a card indicating date Nexplanon was inserted and date it needs to be removed.   Follow-up PRN problems.  Doreene Burke, CNM

## 2017-09-07 NOTE — Progress Notes (Signed)
Pt is here to have Nexplanon removed. Would like to have it replaced.

## 2017-10-05 ENCOUNTER — Encounter: Payer: BLUE CROSS/BLUE SHIELD | Admitting: Nurse Practitioner

## 2017-10-12 ENCOUNTER — Other Ambulatory Visit: Payer: Self-pay

## 2017-10-12 ENCOUNTER — Encounter: Payer: Self-pay | Admitting: Nurse Practitioner

## 2017-10-12 ENCOUNTER — Other Ambulatory Visit: Payer: Self-pay | Admitting: Nurse Practitioner

## 2017-10-12 ENCOUNTER — Ambulatory Visit (INDEPENDENT_AMBULATORY_CARE_PROVIDER_SITE_OTHER): Payer: BLUE CROSS/BLUE SHIELD | Admitting: Nurse Practitioner

## 2017-10-12 VITALS — BP 112/68 | HR 70 | Temp 98.2°F | Ht 62.5 in | Wt 175.8 lb

## 2017-10-12 DIAGNOSIS — Z0000 Encounter for general adult medical examination without abnormal findings: Secondary | ICD-10-CM

## 2017-10-12 DIAGNOSIS — Z124 Encounter for screening for malignant neoplasm of cervix: Secondary | ICD-10-CM | POA: Diagnosis not present

## 2017-10-12 MED ORDER — FLUCONAZOLE 150 MG PO TABS: 150.0000 mg | ORAL_TABLET | Freq: Once | ORAL | 0 refills | Status: AC

## 2017-10-12 NOTE — Patient Instructions (Addendum)
Karen Sanders, Thank you for coming in to clinic today.  1. No new findings on physical exam except a vaginal yeast infection. - START diflucan 150 mg tablet. Take one tablet today.  Then, Repeat in 3 days.  2. You will be due for FASTING BLOOD WORK.  This means you should eat no food or drink after midnight.  Drink only water or coffee without cream/sugar on the morning of your lab visit. - Please go ahead and schedule a "Lab Only" visit in the morning at the clinic for lab draw in the next 7 days. - Your results will be available about 2-3 days after blood draw.  If you have set up a MyChart account, you can can log in to MyChart online to view your results and a brief explanation. Also, we can discuss your results together at your next office visit if you would like.   Please schedule a follow-up appointment with Wilhelmina McardleLauren Larah Kuntzman, AGNP. Return in about 1 year (around 10/12/2018) for annual physical.  If you have any other questions or concerns, please feel free to call the clinic or send a message through MyChart. You may also schedule an earlier appointment if necessary.  You will receive a survey after today's visit either digitally by e-mail or paper by [employer] mail. Your experiences and feedback matter to us.  Please respond so we know how we are doing as we provide care for you.   Wilhelmina McardleLauren Pang Robers, DNP, AGNP-BC Adult Gerontology Nurse Practitioner [facility], [facility]

## 2017-10-12 NOTE — Progress Notes (Signed)
Subjective:    Patient ID: Karen Sanders, female    DOB: 1991/05/05, 26 y.o.   MRN: [id number]  Karen Sanders is a 26 y.o. female presenting on 10/12/2017 for Annual Exam   HPI Annual Physical Exam Patient has been feeling well.  They have no acute concerns today. Sleeps 4-5 hours per night uninterrupted.  HEALTH MAINTENANCE: Weight/BMI: down 10 lbs in 1 month (not hungry as often - decreased appetite possibly r/t stress)  Physical activity: no regular activity outside of work and school Diet: salads mostly from home (w/o meat) if from restaurant w/ grilled chicken (less fried foods) Seatbelt: always Sunscreen: regular use PAP: Due today (No prior pap) HIV: [facility].  New sexual partners since last screening.  Agrees to screening again today. No new sexual partner in last 3 years, but 2 partners since last STD check. 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   Past Medical History:  Diagnosis Date  . Allergy   . Hypertrophy of tonsils and adenoids   . Wears contact lenses    Past Surgical History:  Procedure Laterality Date  . birth control implant     . NO PAST SURGERIES    . WISDOM TOOTH EXTRACTION     Social History   Socioeconomic History  . Marital status: Single    Spouse name: Not on file  . Number of children: Not on file  . Years of education: Not on file  . Highest education level: Not on file  Social Needs  . Financial resource strain: Not on file  . Food insecurity - worry: Not on file  . Food insecurity - inability: Not on file  . Transportation needs - medical: Not on file  . Transportation needs - non-medical: Not on file  Occupational History  . Not on file  Tobacco Use  . Smoking status: Never Smoker  . Smokeless tobacco: Never Used  Substance  and Sexual Activity  . Alcohol use: No    Alcohol/week: 0.0 oz  . Drug use: No  . Sexual activity: Yes    Birth control/protection: Implant, Condom  Other Topics Concern  . Not on file  Social History Narrative  . Not on file   Family History  Problem Relation Age of Onset  . Heart disease Mother   . Breast cancer Neg Hx   . Ovarian cancer Neg Hx   . Colon cancer Neg Hx   . Diabetes Neg Hx    Current Outpatient Medications on File Prior to Visit  Medication Sig  . etonogestrel (IMPLANON) 68 MG IMPL implant Inject into the skin.  Marland Kitchen. etonogestrel (NEXPLANON) 68 MG IMPL implant 68 mg.   No current facility-administered medications on file prior to visit.     Review of Systems  Constitutional: Positive for appetite change and unexpected weight change.   Per HPI unless specifically indicated above     Objective:    BP 112/68 (BP Location: Right Arm, Patient Position: Sitting, Cuff Size: Normal)   Pulse 70   Temp 98.2 F (36.8 C) (Oral)   Ht 5' 2.5" (1.588 m)   Wt 175 lb 12.8 oz (79.7 kg)   BMI 31.64 kg/m   Wt Readings from Last 3 Encounters:  10/12/17 175 lb 12.8 oz (79.7 kg)  09/07/17 185 lb 8 oz (  84.1 kg)  08/03/17 187 lb 9.6 oz (85.1 kg)    Physical Exam General - obese, well-appearing, NAD HEENT - Normocephalic, atraumatic, PERRL, EOMI, patent nares w/o congestion, oropharynx clear, MMM Neck - supple, non-tender, no LAD, no thyromegaly Heart - RRR, no murmurs heard Lungs - Clear throughout all lobes, no wheezing, crackles, or rhonchi. Normal work of breathing. Abdomen - soft, NTND, no masses, no hepatosplenomegaly, active bowel sounds GU - Normal external female genitalia without lesions or fusion. Vaginal canal without lesions. Normal appearing cervix without lesions or friability. Thick, white odorous discharge on exam. Bimanual exam without adnexal masses, enlarged uterus, or cervical motion tenderness. Extremeties - non-tender, no edema, cap refill < 2  seconds, peripheral pulses intact +2 bilaterally Skin - warm, dry, no rashes Neuro - awake, alert, oriented x3, CN II-X intact, intact muscle strength 5/5 bilaterally, intact distal sensation to light touch, normal coordination, normal gait Psych - Normal mood and affect, normal behavior    Results for orders placed or performed in visit on 05/04/17  NuSwab Vaginitis Plus (VG+)  Result Value Ref Range   Atopobium vaginae High - 2 (A) Score   BVAB 2 High - 2 (A) Score   Megasphaera 1 High - 2 (A) Score   Candida albicans, NAA Positive (A) Negative   Candida glabrata, NAA Negative Negative   Trich vag by NAA Negative Negative   Chlamydia trachomatis, NAA Negative Negative   Neisseria gonorrhoeae, NAA Negative Negative      Assessment & Plan:   Problem List Items Addressed This Visit    None    Visit Diagnoses    Annual physical exam    -  Primary Physical exam with no new findings.  Well adult with no acute concerns.  Plan: 1. Obtain health maintenance screenings. - screening labs ordered and screening PAP smear performed for cervical ca screening. 2. Return 1 year for annual physical.   Relevant Orders   TSH   Lipid Profile   Comprehensive metabolic panel   CBC with Differential   Pap Lb, rfx HPV ASCU      Meds ordered this encounter  Medications  . etonogestrel (IMPLANON) 68 MG IMPL implant    Sig: Inject into the skin.    Follow up plan: Return in about 1 year (around 10/12/2018) for annual physical.  Wilhelmina McardleLauren Slayter Moorhouse, DNP, AGPCNP-BC Adult Gerontology Primary Care Nurse Practitioner [facility] 10/12/2017, 3:38 PM

## 2017-10-16 ENCOUNTER — Other Ambulatory Visit: Payer: BLUE CROSS/BLUE SHIELD

## 2017-10-16 DIAGNOSIS — Z0000 Encounter for general adult medical examination without abnormal findings: Secondary | ICD-10-CM | POA: Diagnosis not present

## 2017-10-16 LAB — COMPREHENSIVE METABOLIC PANEL
AG Ratio: 1.6 (calc) (ref 1.0–2.5)
ALT: 10 U/L (ref 6–29)
AST: 11 U/L (ref 10–30)
Albumin: 4.3 g/dL (ref 3.6–5.1)
Alkaline phosphatase (APISO): 47 U/L (ref 33–115)
BUN: 11 mg/dL (ref 7–25)
CO2: 25 mmol/L (ref 20–32)
Calcium: 9.2 mg/dL (ref 8.6–10.2)
Chloride: 105 mmol/L (ref 98–110)
Creat: 0.81 mg/dL (ref 0.50–1.10)
Globulin: 2.7 g/dL (calc) (ref 1.9–3.7)
Glucose, Bld: 91 mg/dL (ref 65–99)
Potassium: 4.1 mmol/L (ref 3.5–5.3)
Sodium: 138 mmol/L (ref 135–146)
Total Bilirubin: 0.5 mg/dL (ref 0.2–1.2)
Total Protein: 7 g/dL (ref 6.1–8.1)

## 2017-10-16 LAB — CBC WITH DIFFERENTIAL/PLATELET
Basophils Absolute: 78 cells/uL (ref 0–200)
Basophils Relative: 0.8 %
Eosinophils Absolute: 137 cells/uL (ref 15–500)
Eosinophils Relative: 1.4 %
HCT: 44.5 % (ref 35.0–45.0)
Hemoglobin: 15.1 g/dL (ref 11.7–15.5)
Lymphs Abs: 2832 cells/uL (ref 850–3900)
MCH: 27.8 pg (ref 27.0–33.0)
MCHC: 33.9 g/dL (ref 32.0–36.0)
MCV: 82 fL (ref 80.0–100.0)
MPV: 11.5 fL (ref 7.5–12.5)
Monocytes Relative: 6 %
Neutro Abs: 6164 cells/uL (ref 1500–7800)
Neutrophils Relative %: 62.9 %
Platelets: 320 10*3/uL (ref 140–400)
RBC: 5.43 10*6/uL — ABNORMAL HIGH (ref 3.80–5.10)
RDW: 12.6 % (ref 11.0–15.0)
Total Lymphocyte: 28.9 %
WBC mixed population: 588 cells/uL (ref 200–950)
WBC: 9.8 10*3/uL (ref 3.8–10.8)

## 2017-10-16 LAB — LIPID PANEL
Cholesterol: 166 mg/dL (ref <200)
HDL: 45 mg/dL — ABNORMAL LOW (ref >50)
LDL Cholesterol (Calc): 99 mg/dL (calc)
Non-HDL Cholesterol (Calc): 121 mg/dL (calc) (ref <130)
Total CHOL/HDL Ratio: 3.7 (calc) (ref <5.0)
Triglycerides: 122 mg/dL (ref <150)

## 2017-10-16 LAB — TSH: TSH: 0.75 mIU/L

## 2017-10-18 LAB — PAP LB, RFX HPV ASCU: PAP Smear Comment: 0

## 2017-12-20 ENCOUNTER — Telehealth: Payer: Self-pay | Admitting: Nurse Practitioner

## 2017-12-20 NOTE — Telephone Encounter (Signed)
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 Pt call back  # is  [phone]

## 2018-10-11 ENCOUNTER — Encounter: Payer: BLUE CROSS/BLUE SHIELD | Admitting: Nurse Practitioner

## 2018-10-11 ENCOUNTER — Encounter: Payer: Self-pay | Admitting: Nurse Practitioner

## 2018-10-11 ENCOUNTER — Ambulatory Visit (INDEPENDENT_AMBULATORY_CARE_PROVIDER_SITE_OTHER): Payer: BLUE CROSS/BLUE SHIELD | Admitting: Nurse Practitioner

## 2018-10-11 VITALS — BP 119/76 | HR 69 | Temp 98.6°F | Resp 12 | Ht 62.5 in | Wt 178.0 lb

## 2018-10-11 DIAGNOSIS — Z23 Encounter for immunization: Secondary | ICD-10-CM | POA: Diagnosis not present

## 2018-10-11 DIAGNOSIS — Z0000 Encounter for general adult medical examination without abnormal findings: Secondary | ICD-10-CM | POA: Diagnosis not present

## 2018-10-11 DIAGNOSIS — Z111 Encounter for screening for respiratory tuberculosis: Secondary | ICD-10-CM | POA: Diagnosis not present

## 2018-10-11 LAB — POCT URINALYSIS DIPSTICK
Bilirubin, UA: NEGATIVE
Blood, UA: NEGATIVE
Glucose, UA: NEGATIVE
Ketones, UA: NEGATIVE
Leukocytes, UA: NEGATIVE
Nitrite, UA: NEGATIVE
Protein, UA: NEGATIVE
Spec Grav, UA: 1.015 (ref 1.010–1.025)
Urobilinogen, UA: 0.2 E.U./dL
pH, UA: 6.5 (ref 5.0–8.0)

## 2018-10-11 NOTE — Progress Notes (Signed)
Subjective:    Patient ID: Karen Sanders, female    DOB: 06-Apr-1991, 27 y.o.   MRN: [id number]  Karen Sanders is a 27 y.o. female presenting on 10/11/2018 for Annual Exam  HPI Annual Physical Exam Patient has been feeling well.  They have no acute concerns today. Sleeps 6-7 hours per night uninterrupted.  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 Diet: generally healthy diet, still convenience meals but uses lots of leftover meals. Seatbelt: always Sunscreen: regularly PAP: 10/12/2017 - negative HIV/HEP C/STDs: declines, single sexual partner Optometry: regular Dentistry: regular  VACCINES: Tetanus: 2018 Influenza: due today  Past Medical History:  Diagnosis Date  . Allergy   . Hypertrophy of tonsils and adenoids   . Wears contact lenses    Past Surgical History:  Procedure Laterality Date  . birth control implant     . NO PAST SURGERIES    . TONSILLECTOMY AND ADENOIDECTOMY N/A 03/15/2016   Procedure: TONSILLECTOMY AND ADENOIDECTOMY;  Surgeon: Bud Face, MD;  [facility];  Service: ENT;  Laterality: N/A;  . WISDOM TOOTH EXTRACTION     Social History   Socioeconomic History  . Marital status: Single    Spouse name: Not on file  . Number of children: Not on file  . Years of education: Not on file  . Highest education level: Not on file  Occupational History  . Not on file  Social Needs  . Financial resource strain: Not on file  . Food insecurity:    Worry: Not on file    Inability: Not on file  . Transportation needs:    Medical: Not on file    Non-medical: Not on file  Tobacco Use  . Smoking status: Never Smoker  . Smokeless tobacco: Never Used  Substance and Sexual Activity  . Alcohol use: No    Alcohol/week: 0.0 standard drinks  . Drug use: No  . Sexual activity: Yes    Birth control/protection: Implant, Condom  Lifestyle  . Physical activity:    Days per  week: Not on file    Minutes per session: Not on file  . Stress: Not on file  Relationships  . Social connections:    Talks on phone: Not on file    Gets together: Not on file    Attends religious service: Not on file    Active member of club or organization: Not on file    Attends meetings of clubs or organizations: Not on file    Relationship status: Not on file  . Intimate partner violence:    Fear of current or ex partner: Not on file    Emotionally abused: Not on file    Physically abused: Not on file    Forced sexual activity: Not on file  Other Topics Concern  . Not on file  Social History Narrative  . Not on file   Family History  Problem Relation Age of Onset  . Heart disease Mother   . Breast cancer Neg Hx   . Ovarian cancer Neg Hx   . Colon cancer Neg Hx   . Diabetes Neg Hx    Current Outpatient Medications on File Prior to Visit  Medication Sig  . etonogestrel (NEXPLANON) 68 MG IMPL implant 68 mg.   No current facility-administered medications on file prior to visit.     Review of Systems  Constitutional: Negative for chills and fever.  HENT: Negative for congestion and sore throat.  Eyes: Negative for pain.  Respiratory: Negative for cough, shortness of breath and wheezing.   Cardiovascular: Negative for chest pain, palpitations and leg swelling.  Gastrointestinal: Negative for abdominal pain, blood in stool, constipation, diarrhea, nausea and vomiting.  Endocrine: Negative for polydipsia.  Genitourinary: Negative for dysuria, frequency, hematuria and urgency.  Musculoskeletal: Negative for back pain, myalgias and neck pain.  Skin: Negative.  Negative for rash.  Allergic/Immunologic: Negative for environmental allergies.  Neurological: Negative for dizziness, weakness and headaches.  Hematological: Does not bruise/bleed easily.  Psychiatric/Behavioral: Negative for dysphoric mood and suicidal ideas. The patient is not nervous/anxious.    Per HPI  unless specifically indicated above     Objective:    BP 119/76 (BP Location: Right Arm, Patient Position: Sitting, Cuff Size: Normal)   Pulse 69   Temp 98.6 F (37 C)   Resp 12   Ht 5' 2.5" (1.588 m)   Wt 178 lb (80.7 kg)   SpO2 100%   BMI 32.04 kg/m   Wt Readings from Last 3 Encounters:  10/11/18 178 lb (80.7 kg)  10/12/17 175 lb 12.8 oz (79.7 kg)  09/07/17 185 lb 8 oz (84.1 kg)    Physical Exam  Constitutional: She is oriented to person, place, and time. She appears well-developed and well-nourished. No distress.  HENT:  Head: Normocephalic and atraumatic.  Right Ear: External ear normal.  Left Ear: External ear normal.  Nose: Nose normal.  Mouth/Throat: Oropharynx is clear and moist.  Eyes: Pupils are equal, round, and reactive to light. Conjunctivae are normal.  Neck: Normal range of motion. Neck supple. No JVD present. No tracheal deviation present. No thyromegaly present.  Cardiovascular: Normal rate, regular rhythm, normal heart sounds and intact distal pulses. Exam reveals no gallop and no friction rub.  No murmur heard. Pulmonary/Chest: Effort normal and breath sounds normal. No respiratory distress.  Breast - Normal exam w/ symmetric breasts, no mass, no nipple discharge, no skin changes or tenderness.   Abdominal: Soft. Bowel sounds are normal. She exhibits no distension. There is no hepatosplenomegaly. There is no tenderness.  Musculoskeletal: Normal range of motion.  Lymphadenopathy:    She has no cervical adenopathy.  Neurological: She is alert and oriented to person, place, and time. No cranial nerve deficit.  Skin: Skin is warm and dry. Capillary refill takes less than 2 seconds.  Psychiatric: She has a normal mood and affect. Her behavior is normal. Judgment and thought content normal.  Nursing note and vitals reviewed.    Results for orders placed or performed in visit on 10/12/17  Pap Lb, rfx HPV ASCU  Result Value Ref Range   DIAGNOSIS: Comment     Specimen adequacy: Comment    Performed by: Comment    QC reviewed by: Comment    PAP Smear Comment .    Note: Comment    PAP Reflex Comment       Assessment & Plan:   Problem List Items Addressed This Visit    None    Visit Diagnoses    Annual physical exam    -  Primary   Relevant Orders   Flu Vaccine QUAD 36+ mos IM   Glucose   CBC with Differential/Platelet   POCT urinalysis dipstick   TB Skin Test   Screening-pulmonary TB       Relevant Orders   TB Skin Test    Annual physical exam with no new findings.  Well adult with no acute concerns.  Is requesting forms  for health physical to be completed for school. Patient is in school for [employer] training.  Plan: 1. Obtain health maintenance screenings as above according to age. - Increase physical activity to 30 minutes most days of the week.  - Eat healthy diet high in vegetables and fruits; low in refined carbohydrates. - Administer flu vaccine today - PPD placed today 2. Return 1 year for annual physical.      Follow up plan: Return in about 1 year (around 10/12/2019) for annual physical.  Wilhelmina Mcardle, DNP, AGPCNP-BC Adult Gerontology Primary Care Nurse Practitioner [facility] 10/11/2018, 3:10 PM

## 2018-10-11 NOTE — Patient Instructions (Addendum)
Karen Sanders,   Thank you for coming in to clinic today.  1. Return to clinic Monday before 3:30 for reading your Tb skin test.    2. You will be due for FASTING BLOOD WORK.  This means you should eat no food or drink after midnight.  Drink only water or coffee without cream/sugar on the morning of your lab visit. - Please go ahead and schedule a "Lab Only" visit in the morning at the clinic for lab draw in the next 7 days. - Your results will be available about 2-3 days after blood draw.  If you have set up a MyChart account, you can can log in to MyChart online to view your results and a brief explanation. Also, we can discuss your results together at your next office visit if you would like.   3.  Once your labs return, we will finalize your paperwork.   Please schedule a follow-up appointment with Wilhelmina Mcardle, AGNP. Return in about 1 year (around 10/12/2019) for annual physical.  If you have any other questions or concerns, please feel free to call the clinic or send a message through MyChart. You may also schedule an earlier appointment if necessary.  You will receive a survey after today's visit either digitally by e-mail or paper by [employer]. Your experiences and feedback matter to [place].  Please respond so we know how we are doing as we provide care for you.   Wilhelmina Mcardle, DNP, AGNP-BC Adult Gerontology Nurse Practitioner [facility], [facility]

## 2018-10-14 ENCOUNTER — Other Ambulatory Visit: Payer: BLUE CROSS/BLUE SHIELD

## 2018-10-14 DIAGNOSIS — Z0000 Encounter for general adult medical examination without abnormal findings: Secondary | ICD-10-CM | POA: Diagnosis not present

## 2018-10-15 LAB — CBC WITH DIFFERENTIAL/PLATELET
Basophils Absolute: 74 cells/uL (ref 0–200)
Basophils Relative: 0.8 %
Eosinophils Absolute: 112 cells/uL (ref 15–500)
Eosinophils Relative: 1.2 %
HCT: 45.2 % — ABNORMAL HIGH (ref 35.0–45.0)
Hemoglobin: 15.7 g/dL — ABNORMAL HIGH (ref 11.7–15.5)
Lymphs Abs: 2492 cells/uL (ref 850–3900)
MCH: 28.9 pg (ref 27.0–33.0)
MCHC: 34.7 g/dL (ref 32.0–36.0)
MCV: 83.1 fL (ref 80.0–100.0)
MPV: 11.5 fL (ref 7.5–12.5)
Monocytes Relative: 8.1 %
Neutro Abs: 5868 cells/uL (ref 1500–7800)
Neutrophils Relative %: 63.1 %
Platelets: 294 10*3/uL (ref 140–400)
RBC: 5.44 10*6/uL — ABNORMAL HIGH (ref 3.80–5.10)
RDW: 12.8 % (ref 11.0–15.0)
Total Lymphocyte: 26.8 %
WBC mixed population: 753 cells/uL (ref 200–950)
WBC: 9.3 10*3/uL (ref 3.8–10.8)

## 2018-10-15 LAB — GLUCOSE, RANDOM: Glucose, Bld: 86 mg/dL (ref 65–99)

## 2019-03-03 ENCOUNTER — Encounter: Payer: Self-pay | Admitting: Nurse Practitioner

## 2019-03-03 ENCOUNTER — Other Ambulatory Visit: Payer: Self-pay

## 2019-03-03 ENCOUNTER — Encounter: Payer: Self-pay | Admitting: Family Medicine

## 2019-03-03 ENCOUNTER — Ambulatory Visit (INDEPENDENT_AMBULATORY_CARE_PROVIDER_SITE_OTHER): Payer: BLUE CROSS/BLUE SHIELD | Admitting: Family Medicine

## 2019-03-03 DIAGNOSIS — L309 Dermatitis, unspecified: Secondary | ICD-10-CM

## 2019-03-03 DIAGNOSIS — L239 Allergic contact dermatitis, unspecified cause: Secondary | ICD-10-CM

## 2019-03-03 DIAGNOSIS — L301 Dyshidrosis [pompholyx]: Secondary | ICD-10-CM

## 2019-03-03 MED ORDER — HYDROXYZINE HCL 10 MG PO TABS: 10.0000 mg | ORAL_TABLET | Freq: Three times a day (TID) | ORAL | 0 refills | Status: DC | PRN

## 2019-03-03 MED ORDER — PREDNISONE 20 MG PO TABS: ORAL_TABLET | ORAL | 0 refills | Status: DC

## 2019-03-03 NOTE — Progress Notes (Signed)
Virtual Visit via Telephone The purpose of this virtual visit is to provide medical care while limiting exposure to the novel coronavirus (COVID19) for both patient and office staff.  Consent was obtained for phone visit:  Yes.   Answered questions that patient had about telehealth interaction:  Yes.   I discussed the limitations, risks, security and privacy concerns of performing an evaluation and management service by telephone. I also discussed with the patient that there may be a patient responsible charge related to this service. The patient expressed understanding and agreed to proceed.  Patient Location: Home Provider Location: [facility])  ---------------------------------------------------------------------- CC: Rash, hands  S: Reviewed CMA telephone note below. I have called patient and gathered additional HPI as follows:  Reports that symptoms started >1 week ago, on left hand dorsal itchy red raised bumps, it improved, she thought it was possibly linked to only trigger with at gym [employer] - she says with sanitizing spray. Has not been to gym in past 1-2 weeks, then about 1 week ago it started on underside or palmar aspect of her left hand, seems to have "heat" to it with redness and swelling, and pain if she "closes fist" - History of atopic dermatitis, was referred to [facility] in [place] but she could not get in to see them while she had rash they wanted to do skin biopsy but were unable to do this - In past she had a rash eczema - usually on underside of her Left wrist, usually size of quarter or less, often present for about 1 week max, in past she Triamcinolone cream 0.5% she believes, as a result she has always been very cautious about any topical  - No new medications or other exposures, food - She works packing and shipping compression socks, and thinks no new exposure at work - Also in past she was treated in 2018 by  Urgent care with prednisone course and hydroxyzine for itching that worked for [employer] bite reaction  Admits rash extension from LEFT to RIGHT HAND, some hive-like raised red rash Denies any fevers, chills, sweats, body ache, cough, shortness of breath, nausea vomiting  Past Medical History:  Diagnosis Date  . Allergy   . Hypertrophy of tonsils and adenoids   . Wears contact lenses    Social History   Tobacco Use  . Smoking status: Never Smoker  . Smokeless tobacco: Never Used  Substance Use Topics  . Alcohol use: No    Alcohol/week: 0.0 standard drinks  . Drug use: No    Current Outpatient Medications:  .  etonogestrel (NEXPLANON) 68 MG IMPL implant, 68 mg., Disp: , Rfl:  .  hydrOXYzine (ATARAX/VISTARIL) 10 MG tablet, Take 1 tablet (10 mg total) by mouth 3 (three) times daily as needed for itching., Disp: 30 tablet, Rfl: 0 .  predniSONE (DELTASONE) 20 MG tablet, Take daily with food. Start with 60mg  (3 pills) x 2 days, then reduce to 40mg  (2 pills) x 2 days, then 20mg  (1 pill) x 3 days, Disp: 13 tablet, Rfl: 0  Depression screen [facility] 2/9 03/03/2019 10/12/2017  Decreased Interest 0 0  Down, Depressed, Hopeless 0 0  PHQ - 2 Score 0 0    No flowsheet data found.  -------------------------------------------------------------------------- O: No physical exam performed due to remote telephone encounter.  Reviewed pictures in mychart message today, see attached files below             No results found for this or  any previous visit (from the past 2160 hour(s)).  -------------------------------------------------------------------------- A&P:  Problem List Items Addressed This Visit    None    Visit Diagnoses    Eczema of both hands    -  Primary   Relevant Medications   predniSONE (DELTASONE) 20 MG tablet   hydrOXYzine (ATARAX/VISTARIL) 10 MG tablet   Other Relevant Orders   Ambulatory referral to Dermatology   Allergic contact dermatitis, unspecified trigger        Relevant Medications   predniSONE (DELTASONE) 20 MG tablet   hydrOXYzine (ATARAX/VISTARIL) 10 MG tablet   Other Relevant Orders   Ambulatory referral to Dermatology   Dyshidrotic eczema         Clinically with concerning worsening atopic type dermatitis of Left hand and some involvement of R hand, without any new known exposures except cleansing spray at gym, otherwise no new topical, food, medication or other triggers. - Seems onset left hand similar to prior eczema / atopic derm flares, and then worsening spread and some affecting R hand - suspicious for contact dermatitis type - Possible dyshidrotic eczema given location and images reviewed - Concern with redness and some pain due to swelling with rash and significant itching - does not seem consistent with secondary bacterial infection - given no actual injury or laceration or other evidence of infection, afebrile - also unlikely if spreading from L hand to R hand would not be indicative of cellulitis or bacterial infection  Plan - Trial on prednisone taper over 7 days, for atopic rash, similar required in past for other types of rash, calm down inflammatory response as advised - May hold topical triamcinolone (also note this is likely expired, do not see this on her chart anywhere as active rx) - Rx Hydroxyzine PRN itching - Referral to [facility] - for further evaluation of chronic atopic / eczema hand involvement, - patient requesting 2nd opinion, since prior dermatology at [place] did not work out she could never get in to be seen to have eval of her rash and never had skin biopsy   Meds ordered this encounter  Medications  . predniSONE (DELTASONE) 20 MG tablet    Sig: Take daily with food. Start with  (3 pills) x 2 days, then reduce to  (2 pills) x 2 days, then  (1 pill) x 3 days    Dispense:  13 tablet    Refill:  0  . hydrOXYzine (ATARAX/VISTARIL) 10 MG tablet    Sig: Take 1 tablet  (10 mg total) by mouth 3 (three) times daily as needed for itching.    Dispense:  30 tablet    Refill:  0   Orders Placed This Encounter  Procedures  . Ambulatory referral to Dermatology    Referral Priority:   Routine    Referral Type:   Consultation    Referral Reason:   Specialty Services Required    Referred to Provider:   Willeen Niece, MD    Requested Specialty:   Dermatology    Number of Visits Requested:   1   Follow-up: - Return as needed  Strict return criteria if worsening redness, pain, fever or new concern of infection develop  Patient verbalizes understanding with the above medical recommendations including the limitation of remote medical advice.  Specific follow-up and call-back criteria were given for patient to follow-up or seek medical care more urgently if needed.   - Time spent in direct consultation with patient on phone: 11 minutes  Saralyn Pilar, DO [facility] 03/03/2019, 2:47 PM

## 2019-03-03 NOTE — Patient Instructions (Addendum)
Start prednisone taper May hold off on Triamcinolone while taking prednisone Use Hydroxyzine as needed for itching Avoid new contact on hand to limit potential other exposures Referral to Dermatology  [facility]   [address] [place], [place] [address] Hours: 8AM-5PM Phone: [phone]  If worsening redness swelling, pain or new symptoms of fever chills spreading symptoms or new concerns call back more urgently and we can help triage your symptoms to determine what to do next. May seek care at hospital or urgent care if not improving.  If you have any other questions or concerns, please feel free to call the office or send a message through MyChart. You may also schedule an earlier appointment if necessary.  Additionally, you may be receiving a survey about your experience at our office within a few days to 1 week by e-mail or mail. We value your feedback.  Saralyn Pilar, DO [facility], [place]

## 2019-03-04 DIAGNOSIS — D485 Neoplasm of uncertain behavior of skin: Secondary | ICD-10-CM | POA: Diagnosis not present

## 2019-03-04 DIAGNOSIS — L309 Dermatitis, unspecified: Secondary | ICD-10-CM | POA: Diagnosis not present

## 2019-03-04 DIAGNOSIS — D2239 Melanocytic nevi of other parts of face: Secondary | ICD-10-CM | POA: Diagnosis not present

## 2019-03-04 DIAGNOSIS — R21 Rash and other nonspecific skin eruption: Secondary | ICD-10-CM | POA: Diagnosis not present

## 2019-03-25 DIAGNOSIS — L308 Other specified dermatitis: Secondary | ICD-10-CM | POA: Diagnosis not present

## 2019-03-25 DIAGNOSIS — L301 Dyshidrosis [pompholyx]: Secondary | ICD-10-CM | POA: Diagnosis not present

## 2019-09-26 ENCOUNTER — Encounter: Payer: Self-pay | Admitting: Emergency Medicine

## 2019-09-26 ENCOUNTER — Other Ambulatory Visit: Payer: Self-pay

## 2019-09-26 ENCOUNTER — Ambulatory Visit
Admission: EM | Admit: 2019-09-26 | Discharge: 2019-09-26 | Disposition: A | Discharge status: Home / Self Care | Payer: BC Managed Care – PPO | Attending: Family Medicine | Admitting: Family Medicine

## 2019-09-26 DIAGNOSIS — L03114 Cellulitis of left upper limb: Secondary | ICD-10-CM | POA: Diagnosis not present

## 2019-09-26 MED ORDER — SULFAMETHOXAZOLE-TRIMETHOPRIM 800-160 MG PO TABS: 1.0000 | ORAL_TABLET | Freq: Two times a day (BID) | ORAL | 0 refills | Status: DC

## 2019-09-26 NOTE — Discharge Instructions (Signed)
Warm compresses to area °

## 2019-09-26 NOTE — ED Triage Notes (Signed)
Patient c/o itchy red rash on her left arm that started yesterday.  Patient states that it feels warm to the touch.  Patient denies fevers.

## 2019-09-26 NOTE — ED Provider Notes (Signed)
[facility]    CSN: [id number] Arrival date & time: 09/26/19  [facility]      History   Chief Complaint Chief Complaint  Patient presents with  . Rash    left arm    HPI JANALYNN EDER is a 28 y.o. female.   28 yo female with a c/o tender, itchy, warm rash to her left arm skin since yesterday. Denies any injuries, fevers, chills.    Rash   Past Medical History:  Diagnosis Date  . Allergy   . Hypertrophy of tonsils and adenoids   . Wears contact lenses     There are no active problems to display for this patient.   Past Surgical History:  Procedure Laterality Date  . birth control implant     . NO PAST SURGERIES    . TONSILLECTOMY AND ADENOIDECTOMY N/A 03/15/2016   Procedure: TONSILLECTOMY AND ADENOIDECTOMY;  Surgeon: Carloyn Manner, MD;  Location: [facility];  Service: ENT;  Laterality: N/A;  . WISDOM TOOTH EXTRACTION      OB History    Gravida  0   Para  0   Term  0   Preterm  0   AB  0   Living  0     SAB  0   TAB  0   Ectopic  0   Multiple  0   Live Births  0            Home Medications    Prior to Admission medications   Medication Sig Start Date End Date Taking? Authorizing Provider  etonogestrel (NEXPLANON) 68 MG IMPL implant 68 mg.   Yes [provider]  hydrOXYzine (ATARAX/VISTARIL) 10 MG tablet Take 1 tablet (10 mg total) by mouth 3 (three) times daily as needed for itching. 03/03/19   Karamalegos, Devonne Doughty, DO  predniSONE (DELTASONE) 20 MG tablet Take daily with food. Start with 60mg  (3 pills) x 2 days, then reduce to 40mg  (2 pills) x 2 days, then 20mg  (1 pill) x 3 days 03/03/19   Olin Hauser, DO  sulfamethoxazole-trimethoprim (BACTRIM DS) 800-160 MG tablet Take 1 tablet by mouth 2 (two) times daily. 09/26/19   Norval Gable, MD    Family History Family History  Problem Relation Age of Onset  . Heart disease Mother   . Breast cancer Neg Hx   . Ovarian cancer Neg Hx   .  Colon cancer Neg Hx   . Diabetes Neg Hx     Social History Social History   Tobacco Use  . Smoking status: Never Smoker  . Smokeless tobacco: Never Used  Substance Use Topics  . Alcohol use: No    Alcohol/week: 0.0 standard drinks  . Drug use: No     Allergies   Amoxicillin   Review of Systems Review of Systems  Skin: Positive for rash.     Physical Exam Triage Vital Signs ED Triage Vitals  Enc Vitals Group     BP 09/26/19 1918 116/82     Pulse Rate 09/26/19 1918 78     Resp 09/26/19 1918 14     Temp 09/26/19 1918 98.6 F (37 C)     Temp Source 09/26/19 1918 Oral     SpO2 09/26/19 1918 100 %     Weight 09/26/19 1917 184 lb (83.5 kg)     Height 09/26/19 1917 5\' 2"  (1.575 m)     Head Circumference --      Peak Flow --  Pain Score 09/26/19 1916 1     Pain Loc --      Pain Edu? --      Excl. in GC? --    No data found.  Updated Vital Signs BP 116/82 (BP Location: Right Arm)   Pulse 78   Temp 98.6 F (37 C) (Oral)   Resp 14   Ht 5\' 2"  (1.575 m)   Wt 83.5 kg   SpO2 100%   BMI 33.65 kg/m   Visual Acuity Right Eye Distance:   Left Eye Distance:   Bilateral Distance:    Right Eye Near:   Left Eye Near:    Bilateral Near:     Physical Exam Vitals signs and nursing note reviewed.  Constitutional:      General: She is not in acute distress.    Appearance: She is not toxic-appearing or diaphoretic.  Skin:    Findings: Rash (3 areas (3x4cm) on skin of left arm with warmth, blanchable erythema and tenderness to palpation; no drainage) present.  Neurological:     Mental Status: She is alert.      UC Treatments / Results  Labs (all labs ordered are listed, but only abnormal results are displayed) Labs Reviewed - No data to display  EKG   Radiology No results found.  Procedures Procedures (including critical care time)  Medications Ordered in UC Medications - No data to display  Initial Impression / Assessment and Plan / UC Course   I have reviewed the triage vital signs and the nursing notes.  Pertinent labs & imaging results that were available during my care of the patient were reviewed by me and considered in my medical decision making (see chart for details).     Final Clinical Impressions(s) / UC Diagnoses   Final diagnoses:  Cellulitis of left arm     Discharge Instructions     Warm compresses to area    ED Prescriptions    Medication Sig Dispense Auth. Provider   sulfamethoxazole-trimethoprim (BACTRIM DS) 800-160 MG tablet Take 1 tablet by mouth 2 (two) times daily. 20 tablet , MD     1. diagnosis reviewed with patient 2. rx as per orders above; reviewed possible side effects, interactions, risks and benefits  3. Recommend supportive treatment as above and otc analgesics prn 4. Follow-up prn if symptoms worsen or don't improve   PDMP not reviewed this encounter.   Payton Mccallum, MD 09/26/19 2013

## 2019-11-17 DIAGNOSIS — Z8249 Family history of ischemic heart disease and other diseases of the circulatory system: Secondary | ICD-10-CM | POA: Insufficient documentation

## 2020-02-20 ENCOUNTER — Other Ambulatory Visit (HOSPITAL_COMMUNITY)
Admission: RE | Admit: 2020-02-20 | Discharge: 2020-02-20 | Disposition: A | Discharge status: Home / Self Care | Payer: BC Managed Care – PPO | Source: Ambulatory Visit | Attending: Family Medicine | Admitting: Family Medicine

## 2020-02-20 ENCOUNTER — Ambulatory Visit (INDEPENDENT_AMBULATORY_CARE_PROVIDER_SITE_OTHER): Payer: Self-pay | Admitting: Family Medicine

## 2020-02-20 ENCOUNTER — Encounter: Payer: Self-pay | Admitting: Family Medicine

## 2020-02-20 ENCOUNTER — Other Ambulatory Visit: Payer: Self-pay

## 2020-02-20 VITALS — BP 127/75 | HR 82 | Temp 98.2°F | Ht 62.0 in | Wt 180.6 lb

## 2020-02-20 DIAGNOSIS — Z7251 High risk heterosexual behavior: Secondary | ICD-10-CM | POA: Insufficient documentation

## 2020-02-20 DIAGNOSIS — Z113 Encounter for screening for infections with a predominantly sexual mode of transmission: Secondary | ICD-10-CM | POA: Insufficient documentation

## 2020-02-20 NOTE — Patient Instructions (Addendum)
As we discussed, we have sent your vaginal swab to the lab for processing.  We will contact you when we receive the results.  Have your labs completed with [facility] and we will notify you when we receive the results.  Abstain from all sexual contact until you receive your results.  As we reviewed, it is important to protect yourself with sexual activity, it is important to have barrier protection with sexual partners (I.e. condoms).  We will plan to see you back in 3 months for your annual wellness exam   You will receive a survey after today's visit either digitally by e-mail or paper by USPS mail. Your experiences and feedback matter to [place].  Please respond so we know how we are doing as we provide care for you.  Call us with any questions/concerns/needs.  It is my goal to be available to you for your health concerns.  Thanks for choosing me to be a partner in your healthcare needs!  Charlaine Dalton, FNP-C Family Nurse Practitioner [facility] Phone: [phone]

## 2020-02-20 NOTE — Progress Notes (Signed)
Subjective:    Patient ID: Karen Sanders, female    DOB: 1991-02-26, 29 y.o.   MRN: [id number]  Karen Sanders is a 29 y.o. female presenting on 02/20/2020 for Exposure to STD (pt concern that she was recently exposed to a STD.)   HPI  [name] presents to clinic for concerns of exposure to an STD.  States that a recent sexual partner, she has heard through the grapevine that he tested positive for an STD, but it is unknown what he has tested positive for.  Denies fevers, sore throat, abdominal pain, n/v/d, vaginal discharge/odor, dysuria, urinary frequency, urgency, hesitancy.  Denies any previous history of STD.  Depression screen [facility] 2/9 03/03/2019 10/12/2017  Decreased Interest 0 0  Down, Depressed, Hopeless 0 0  PHQ - 2 Score 0 0    Social History   Tobacco Use  . Smoking status: Never Smoker  . Smokeless tobacco: Never Used  Substance Use Topics  . Alcohol use: No    Alcohol/week: 0.0 standard drinks  . Drug use: No    Review of Systems  Constitutional: Negative.   HENT: Negative.   Eyes: Negative.   Respiratory: Negative.   Cardiovascular: Negative.   Gastrointestinal: Negative.   Endocrine: Negative.   Genitourinary: Negative.   Musculoskeletal: Negative.   Skin: Negative.   Allergic/Immunologic: Negative.   Neurological: Negative.   Hematological: Negative.   Psychiatric/Behavioral: Negative.    Per HPI unless specifically indicated above     Objective:    BP 127/75 (BP Location: Right Arm, Patient Position: Sitting, Cuff Size: Normal)   Pulse 82   Temp 98.2 F (36.8 C) (Temporal)   Ht 5\' 2"  (1.575 m)   Wt 180 lb 9.6 oz (81.9 kg)   LMP 02/01/2020   BMI 33.03 kg/m   Wt Readings from Last 3 Encounters:  02/20/20 180 lb 9.6 oz (81.9 kg)  09/26/19 184 lb (83.5 kg)  10/11/18 178 lb (80.7 kg)    Physical Exam Vitals reviewed.  Constitutional:      General: She is not in acute distress.    Appearance: Normal appearance. She is  well-groomed. She is obese. She is not ill-appearing or toxic-appearing.  HENT:     Head: Normocephalic.  Eyes:     General: Lids are normal. Vision grossly intact.        Right eye: No discharge.        Left eye: No discharge.     Extraocular Movements: Extraocular movements intact.     Conjunctiva/sclera: Conjunctivae normal.     Pupils: Pupils are equal, round, and reactive to light.  Cardiovascular:     Rate and Rhythm: Normal rate and regular rhythm.     Pulses: Normal pulses.     Heart sounds: Normal heart sounds. No murmur. No friction rub. No gallop.   Pulmonary:     Effort: Pulmonary effort is normal. No respiratory distress.     Breath sounds: Normal breath sounds.  Abdominal:     General: Abdomen is flat. Bowel sounds are normal. There is no distension.     Palpations: Abdomen is soft.     Hernia: There is no hernia in the left inguinal area or right inguinal area.  Genitourinary:    General: Normal vulva.     Exam position: Lithotomy position.     Pubic Area: No rash or pubic lice.      Labia:        Right: No rash, tenderness, lesion  or injury.        Left: No rash, tenderness, lesion or injury.      Urethra: No urethral pain.     Vagina: Normal.     Cervix: Normal.     Uterus: Normal.      Adnexa: Right adnexa normal and left adnexa normal.     Comments: No discharge noted on exam Musculoskeletal:        General: Normal range of motion.     Right lower leg: No edema.     Left lower leg: No edema.  Lymphadenopathy:     Lower Body: No right inguinal adenopathy. No left inguinal adenopathy.  Skin:    General: Skin is warm and dry.     Capillary Refill: Capillary refill takes less than 2 seconds.  Neurological:     General: No focal deficit present.     Mental Status: She is alert and oriented to person, place, and time.     Cranial Nerves: No cranial nerve deficit.     Sensory: No sensory deficit.     Motor: No weakness.     Coordination: Coordination  normal.     Gait: Gait normal.  Psychiatric:        Attention and Perception: Attention and perception normal.        Mood and Affect: Mood and affect normal.        Speech: Speech normal.        Behavior: Behavior normal. Behavior is cooperative.        Thought Content: Thought content normal.        Cognition and Memory: Cognition and memory normal.        Judgment: Judgment normal.     Results for orders placed or performed in visit on 10/11/18  Glucose  Result Value Ref Range   Glucose, Bld 86 65 - 99 mg/dL  CBC with Differential/Platelet  Result Value Ref Range   WBC 9.3 3.8 - 10.8 Thousand/uL   RBC 5.44 (H) 3.80 - 5.10 Million/uL   Hemoglobin 15.7 (H) 11.7 - 15.5 g/dL   HCT 45.2 (H) 35.0 - 45.0 %   MCV 83.1 80.0 - 100.0 fL   MCH 28.9 27.0 - 33.0 pg   MCHC 34.7 32.0 - 36.0 g/dL   RDW 12.8 11.0 - 15.0 %   Platelets 294 140 - 400 Thousand/uL   MPV 11.5 7.5 - 12.5 fL   Neutro Abs 5,868 1,500 - 7,800 cells/uL   Lymphs Abs 2,492 850 - 3,900 cells/uL   WBC mixed population 753 200 - 950 cells/uL   Eosinophils Absolute 112 15 - 500 cells/uL   Basophils Absolute 74 0 - 200 cells/uL   Neutrophils Relative % 63.1 %   Total Lymphocyte 26.8 %   Monocytes Relative 8.1 %   Eosinophils Relative 1.2 %   Basophils Relative 0.8 %  POCT urinalysis dipstick  Result Value Ref Range   Color, UA yellow    Clarity, UA clear    Glucose, UA Negative Negative   Bilirubin, UA negative    Ketones, UA negative    Spec Grav, UA 1.015 1.010 - 1.025   Blood, UA negative    pH, UA 6.5 5.0 - 8.0   Protein, UA Negative Negative   Urobilinogen, UA 0.2 0.2 or 1.0 E.U./dL   Nitrite, UA negative    Leukocytes, UA Negative Negative   Appearance yellow    Odor none       Assessment & Plan:  Problem List Items Addressed This Visit      Other   High risk heterosexual behavior - Primary    High risk heterosexual behavior with recent sexual partner.  STD testing to be completed and will  contact with results.  Requesting full STD panel.  Plan: 1) Vaginal swab taken today and will send to the lab for testing 2) Orders written for blood work for HIV, Hep C, and RPR 3) Abstain from all sexual contact until you receive your results from [place].      Relevant Orders   Hepatitis C Antibody   HIV antibody (with reflex)   RPR   Cervicovaginal ancillary only    Other Visit Diagnoses    Screening examination for sexually transmitted disease       Relevant Orders   Hepatitis C Antibody   HIV antibody (with reflex)   RPR   Cervicovaginal ancillary only      No orders of the defined types were placed in this encounter.     Follow up plan: Return in about 3 months (around 05/22/2020), or if symptoms worsen or fail to improve, for 3 month f/u for annual wellness exam.   Charlaine Dalton, FNP Family Nurse Practitioner [facility] 02/20/2020, 2:21 PM

## 2020-02-20 NOTE — Assessment & Plan Note (Signed)
High risk heterosexual behavior with recent sexual partner.  STD testing to be completed and will contact with results.  Requesting full STD panel.  Plan: 1) Vaginal swab taken today and will send to the lab for testing 2) Orders written for blood work for HIV, Hep C, and RPR 3) Abstain from all sexual contact until you receive your results from [place].

## 2020-02-21 LAB — HEPATITIS C ANTIBODY: Hep C Virus Ab: <0.1 s/co ratio (ref 0.0–0.9)

## 2020-02-21 LAB — HIV ANTIBODY (ROUTINE TESTING W REFLEX): HIV Screen 4th Generation wRfx: NONREACTIVE

## 2020-02-21 LAB — RPR: RPR Ser Ql: NONREACTIVE

## 2020-02-23 NOTE — Progress Notes (Signed)
HIV, Hep C and RPR all non-reactive.  Awaiting cervicovaginal swab for GC/CT/Trich, BV and Candida results.

## 2020-02-24 ENCOUNTER — Other Ambulatory Visit: Payer: Self-pay | Admitting: Family Medicine

## 2020-02-24 DIAGNOSIS — B9689 Other specified bacterial agents as the cause of diseases classified elsewhere: Secondary | ICD-10-CM

## 2020-02-24 LAB — CERVICOVAGINAL ANCILLARY ONLY
Bacterial Vaginitis (gardnerella): POSITIVE — AB
Candida Glabrata: NEGATIVE
Candida Vaginitis: NEGATIVE
Chlamydia: NEGATIVE
Comment: NEGATIVE
Comment: NEGATIVE
Comment: NEGATIVE
Comment: NEGATIVE
Comment: NEGATIVE
Comment: NORMAL
Neisseria Gonorrhea: NEGATIVE
Trichomonas: NEGATIVE

## 2020-02-24 MED ORDER — METRONIDAZOLE 500 MG PO TABS: 500.0000 mg | ORAL_TABLET | Freq: Two times a day (BID) | ORAL | 0 refills | Status: AC

## 2020-02-24 NOTE — Progress Notes (Signed)
Discussed results with patient via telephone.  Sent in Rx for Metronidazole to [facility] in [place].  Denied any additional questions/concerns/needs

## 2020-02-24 NOTE — Progress Notes (Signed)
Sent in Metronidazole for Bacterial Vaginosis.  GC/CT/Trich negative

## 2020-02-27 ENCOUNTER — Ambulatory Visit: Payer: Self-pay

## 2020-09-08 ENCOUNTER — Ambulatory Visit: Payer: Self-pay | Admitting: Certified Nurse Midwife

## 2020-09-09 ENCOUNTER — Ambulatory Visit (INDEPENDENT_AMBULATORY_CARE_PROVIDER_SITE_OTHER): Payer: 59 | Admitting: Family Medicine

## 2020-09-09 ENCOUNTER — Other Ambulatory Visit: Payer: Self-pay

## 2020-09-09 ENCOUNTER — Encounter: Payer: Self-pay | Admitting: Family Medicine

## 2020-09-09 VITALS — BP 102/70 | HR 77 | Temp 97.7°F | Ht 63.0 in | Wt 176.5 lb

## 2020-09-09 DIAGNOSIS — Z114 Encounter for screening for human immunodeficiency virus [HIV]: Secondary | ICD-10-CM

## 2020-09-09 DIAGNOSIS — Z8249 Family history of ischemic heart disease and other diseases of the circulatory system: Secondary | ICD-10-CM

## 2020-09-09 DIAGNOSIS — Z0000 Encounter for general adult medical examination without abnormal findings: Secondary | ICD-10-CM

## 2020-09-09 DIAGNOSIS — E669 Obesity, unspecified: Secondary | ICD-10-CM

## 2020-09-09 DIAGNOSIS — Z1159 Encounter for screening for other viral diseases: Secondary | ICD-10-CM

## 2020-09-09 NOTE — Progress Notes (Signed)
Annual Exam   Chief Complaint:  Chief Complaint  Patient presents with  . Establish Care    no concerns     History of Present Illness:  Ms. Karen Sanders is a 29 y.o. G0P0000 who LMP was No LMP recorded. Patient has had an implant., presents today for her annual examination.      Nutrition/Lifestyle Diet: not very good Exercise: not currently, due to working 2 jobs She does not get adequate calcium and Vitamin D in her diet.  Social History   Tobacco Use  Smoking Status Never Smoker  Smokeless Tobacco Never Used   Social History   Substance and Sexual Activity  Alcohol Use Yes  . Alcohol/week: 0.0 standard drinks   Comment: once a year   Social History   Substance and Sexual Activity  Drug Use No     Safety The patient wears seatbelts: yes.     The patient feels safe at home and in their relationships: yes.  General Health Dentist in the last year: No - making plans Eye doctor: yes  Menstrual Has nexplanon Getting once monthly cycles - sometimes less  GYN She is multiple partners, contraception - condoms most of the time.  Hx of STDs: none She does want STI testing today.  GC/C screening recommended for sexually active women 7024 and younger   Cervical Cancer Screening (Age 52-65) Last Pap:  November 2018 Results were: no abnormalities   Family History of Breast Cancer: no Family History of Ovarian Cancer: no    Weight Wt Readings from Last 3 Encounters:  09/09/20 176 lb 8 oz (80.1 kg)  02/20/20 180 lb 9.6 oz (81.9 kg)  09/26/19 184 lb (83.5 kg)   Patient has high BMI  BMI Readings from Last 1 Encounters:  09/09/20 31.27 kg/m     Chronic disease screening Blood pressure monitoring:  BP Readings from Last 3 Encounters:  09/09/20 102/70  02/20/20 127/75  09/26/19 116/82     Lipid Monitoring: Indication for screening: age 81>40, obesity, diabetes, family hx, CV risk factors.  Lipid screening: Yes  Lab Results  Component Value  Date   CHOL 166 10/16/2017   HDL 45 (L) 10/16/2017   LDLCALC 99 10/16/2017   TRIG 122 10/16/2017   CHOLHDL 3.7 10/16/2017     Diabetes Screening: age 3>40, overweight, family hx, PCOS, hx of gestational diabetes, at risk ethnicity, elevated blood pressure >135/80.  Diabetes Screening screening: Yes  No results found for: HGBA1C    Past Medical History:  Diagnosis Date  . Allergy   . Hypertrophy of tonsils and adenoids   . Wears contact lenses     Past Surgical History:  Procedure Laterality Date  . birth control implant     . TONSILLECTOMY AND ADENOIDECTOMY N/A 03/15/2016   Procedure: TONSILLECTOMY AND ADENOIDECTOMY;  Surgeon: Bud Facereighton Vaught, MD;  [facility];  Service: ENT;  Laterality: N/A;  . WISDOM TOOTH EXTRACTION      Prior to Admission medications   Medication Sig Start Date End Date Taking? Authorizing Provider  etonogestrel (NEXPLANON) 68 MG IMPL implant 68 mg.   Yes [provider]    Allergies  Allergen Reactions  . Amoxicillin Swelling    Gynecologic History: No LMP recorded. Patient has had an implant.  Obstetric History: G0P0000  Social History   Socioeconomic History  . Marital status: Single    Spouse name: Not on file  . Number of children: Not on file  . Years of education:  Not on file  . Highest education level: Not on file  Occupational History  . Not on file  Tobacco Use  . Smoking status: Never Smoker  . Smokeless tobacco: Never Used  Vaping Use  . Vaping Use: Never used  Substance and Sexual Activity  . Alcohol use: Yes    Alcohol/week: 0.0 standard drinks    Comment: once a year  . Drug use: No  . Sexual activity: Yes    Birth control/protection: Implant, Condom  Other Topics Concern  . 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[employer]      Family: Mom lives nearby - good relationship, has siblings -  OK relationships - 2 older, 1 younger      Enjoys: spend time with god-daughter      Exercise: works 2 jobs   Diet: not good      IT sales professional belts: Yes    Guns: Yes  and secure   Safe in relationships: Yes    Social Determinants of Corporate investment banker Strain:   . Difficulty of Paying Living Expenses: Not on file  Food Insecurity:   . Worried About Programme researcher, broadcasting/film/video in the Last Year: Not on file  . Ran Out of Food in the Last Year: Not on file  Transportation Needs:   . Lack of Transportation (Medical): Not on file  . Lack of Transportation (Non-Medical): Not on file  Physical Activity:   . Days of Exercise per Week: Not on file  . Minutes of Exercise per Session: Not on file  Stress:   . Feeling of Stress : Not on file  Social Connections:   . Frequency of Communication with Friends and Family: Not on file  . Frequency of Social Gatherings with Friends and Family: Not on file  . Attends Religious Services: Not on file  . Active Member of Clubs or Organizations: Not on file  . Attends Banker Meetings: Not on file  . Marital Status: Not on file  Intimate Partner Violence:   . Fear of Current or Ex-Partner: Not on file  . Emotionally Abused: Not on file  . Physically Abused: Not on file  . Sexually Abused: Not on file    Family History  Problem Relation Age of Onset  . Heart disease Mother 81       triple bypass  . Diabetes Mother   . Hypertension Mother   . Cancer Maternal Grandmother        unsure what type  . Diabetes Paternal Grandmother   . Breast cancer Neg Hx   . Ovarian cancer Neg Hx   . Colon cancer Neg Hx     Review of Systems  Constitutional: Negative for chills and fever.  HENT: Negative for congestion and sore throat.   Eyes: Negative for blurred vision and double vision.  Respiratory: Negative for shortness of breath.   Cardiovascular: Negative for chest pain.  Gastrointestinal: Negative for heartburn, nausea and  vomiting.  Genitourinary: Negative.   Musculoskeletal: Negative.  Negative for myalgias.  Skin: Negative for rash.  Neurological: Negative for dizziness and headaches.  Endo/Heme/Allergies: Does not bruise/bleed easily.  Psychiatric/Behavioral: Negative for depression. The patient is not nervous/anxious.      Physical Exam BP 102/70   Pulse 77   Temp 97.7 F (36.5 C) (Temporal)   Ht  5\' 3"  (1.6 m)   Wt 176 lb 8 oz (80.1 kg)   SpO2 99%   BMI 31.27 kg/m    BP Readings from Last 3 Encounters:  09/09/20 102/70  02/20/20 127/75  09/26/19 116/82    Wt Readings from Last 3 Encounters:  09/09/20 176 lb 8 oz (80.1 kg)  02/20/20 180 lb 9.6 oz (81.9 kg)  09/26/19 184 lb (83.5 kg)     Physical Exam Constitutional:      General: She is not in acute distress.    Appearance: She is well-developed. She is not diaphoretic.  HENT:     Head: Normocephalic and atraumatic.     Right Ear: External ear normal.     Left Ear: External ear normal.     Nose: Nose normal.  Eyes:     General: No scleral icterus.    Conjunctiva/sclera: Conjunctivae normal.  Cardiovascular:     Rate and Rhythm: Normal rate and regular rhythm.     Heart sounds: No murmur heard.   Pulmonary:     Effort: Pulmonary effort is normal. No respiratory distress.     Breath sounds: Normal breath sounds. No wheezing.  Abdominal:     General: Bowel sounds are normal. There is no distension.     Palpations: Abdomen is soft. There is no mass.     Tenderness: There is no abdominal tenderness. There is no guarding or rebound.  Musculoskeletal:        General: Normal range of motion.     Cervical back: Neck supple.  Lymphadenopathy:     Cervical: No cervical adenopathy.  Skin:    General: Skin is warm and dry.     Capillary Refill: Capillary refill takes less than 2 seconds.  Neurological:     Mental Status: She is alert and oriented to person, place, and time.     Deep Tendon Reflexes: Reflexes normal.   Psychiatric:        Behavior: Behavior normal.       Results: Depression screen [facility] 2/9 03/03/2019 10/12/2017  Decreased Interest 0 0  Down, Depressed, Hopeless 0 0  PHQ - 2 Score 0 0      Assessment: 29 y.o. G0P0000 female here for routine annual examination.  Plan: Problem List Items Addressed This Visit      Other   Family history of early CAD   Relevant Orders   Comprehensive metabolic panel   Lipid panel   Obesity (BMI 30.0-34.9)   Relevant Orders   Comprehensive metabolic panel   Lipid panel   Hemoglobin A1c    Other Visit Diagnoses    Annual physical exam    -  Primary   Relevant Orders   Comprehensive metabolic panel   CBC   Screening for HIV (human immunodeficiency virus)       Relevant Orders   HIV Antibody (routine testing w rflx)   Need for hepatitis C screening test       Relevant Orders   Hepatitis C antibody       Screening: -- Blood pressure screen normal -- cholesterol screening: will obtain -- Weight screening: overweight: continue to monitor -- Diabetes Screening: will obtain -- Nutrition: encouraged healthy diet   Psych -- Depression screening (PHQ-9):    Safety -- tobacco screening: not using -- alcohol screening:  low-risk usage. -- no evidence of domestic violence or intimate partner violence. -- STD screening: gonorrhea/chlamydia NAAT (Recommended for <24 years) collected  Cancer Screening -- pap smear not  collected per ASCCP guidelines -- family history of breast cancer screening: done. not at high risk.   Immunizations Immunization History  Administered Date(s) Administered  . Influenza,inj,Quad PF,6+ Mos 08/03/2017, 10/11/2018  . PPD Test 10/11/2018  . Tdap 08/03/2017    -- flu vaccine - declined today, encouraged return -- TDAP q10 years up to date -- HPV vaccination series (Age <26, shared decision 27-45): has not received - encouraged getting ASAP -- Covid-19 Vaccine - encouraged getting and discussed  options  Encouraged regular vision and dental screening. Encouraged healthy exercise and diet.   Lynnda Child

## 2020-09-09 NOTE — Patient Instructions (Addendum)
Vaccines  - You can call to schedule a nurse visit or get these vaccines at a pharmacy  HPV Flu [place] or [place] are good options   Preventive Care 68-27 Years Old, Female Preventive care refers to visits with your health care provider and lifestyle choices that can promote health and wellness. This includes:  A yearly physical exam. This may also be called an annual well check.  Regular dental visits and eye exams.  Immunizations.  Screening for certain conditions.  Healthy lifestyle choices, such as eating a healthy diet, getting regular exercise, not using drugs or products that contain nicotine and tobacco, and limiting alcohol use. What can I expect for my preventive care visit? Physical exam Your health care provider will check your:  Height and weight. This may be used to calculate body mass index (BMI), which tells if you are at a healthy weight.  Heart rate and blood pressure.  Skin for abnormal spots. Counseling Your health care provider may ask you questions about your:  Alcohol, tobacco, and drug use.  Emotional well-being.  Home and relationship well-being.  Sexual activity.  Eating habits.  Work and work Statistician.  Method of birth control.  Menstrual cycle.  Pregnancy history. What immunizations do I need?  Influenza (flu) vaccine  This is recommended every year. Tetanus, diphtheria, and pertussis (Tdap) vaccine  You may need a Td booster every 10 years. Varicella (chickenpox) vaccine  You may need this if you have not been vaccinated. Human papillomavirus (HPV) vaccine  If recommended by your health care provider, you may need three doses over 6 months. Measles, mumps, and rubella (MMR) vaccine  You may need at least one dose of MMR. You may also need a second dose. Meningococcal conjugate (MenACWY) vaccine  One dose is recommended if you are age 81-21 years and a first-year college student living in a residence hall, or  if you have one of several medical conditions. You may also need additional booster doses. Pneumococcal conjugate (PCV13) vaccine  You may need this if you have certain conditions and were not previously vaccinated. Pneumococcal polysaccharide (PPSV23) vaccine  You may need one or two doses if you smoke cigarettes or if you have certain conditions. Hepatitis A vaccine  You may need this if you have certain conditions or if you travel or work in places where you may be exposed to hepatitis A. Hepatitis B vaccine  You may need this if you have certain conditions or if you travel or work in places where you may be exposed to hepatitis B. Haemophilus influenzae type b (Hib) vaccine  You may need this if you have certain conditions. You may receive vaccines as individual doses or as more than one vaccine together in one shot (combination vaccines). Talk with your health care provider about the risks and benefits of combination vaccines. What tests do I need?  Blood tests  Lipid and cholesterol levels. These may be checked every 5 years starting at age 71.  Hepatitis C test.  Hepatitis B test. Screening  Diabetes screening. This is done by checking your blood sugar (glucose) after you have not eaten for a while (fasting).  Sexually transmitted disease (STD) testing.  BRCA-related cancer screening. This may be done if you have a family history of breast, ovarian, tubal, or peritoneal cancers.  Pelvic exam and Pap test. This may be done every 3 years starting at age 71. Starting at age 53, this may be done every 5 years if  you have a Pap test in combination with an HPV test. Talk with your health care provider about your test results, treatment options, and if necessary, the need for more tests. Follow these instructions at home: Eating and drinking   Eat a diet that includes fresh fruits and vegetables, whole grains, lean protein, and low-fat dairy.  Take vitamin and mineral  supplements as recommended by your health care provider.  Do not drink alcohol if: ? Your health care provider tells you not to drink. ? You are pregnant, may be pregnant, or are planning to become pregnant.  If you drink alcohol: ? Limit how much you have to 0-1 drink a day. ? Be aware of how much alcohol is in your drink. In the [place] one drink equals one 12 oz bottle of beer (355 mL), one 5 oz glass of wine (148 mL), or one 1 oz glass of hard liquor (44 mL). Lifestyle  Take daily care of your teeth and gums.  Stay active. Exercise for at least 30 minutes on 5 or more days each week.  Do not use any products that contain nicotine or tobacco, such as cigarettes, e-cigarettes, and chewing tobacco. If you need help quitting, ask your health care provider.  If you are sexually active, practice safe sex. Use a condom or other form of birth control (contraception) in order to prevent pregnancy and STIs (sexually transmitted infections). If you plan to become pregnant, see your health care provider for a preconception visit. What's next?  Visit your health care provider once a year for a well check visit.  Ask your health care provider how often you should have your eyes and teeth checked.  Stay up to date on all vaccines. This information is not intended to replace advice given to you by your health care provider. Make sure you discuss any questions you have with your health care provider. Document Revised: 08/01/2018 Document Reviewed: 08/01/2018 Elsevier Patient Education  2020 [employer].

## 2020-09-10 LAB — CBC
HCT: 42.4 % (ref 36.0–46.0)
Hemoglobin: 14.3 g/dL (ref 12.0–15.0)
MCHC: 33.7 g/dL (ref 30.0–36.0)
MCV: 87.3 fl (ref 78.0–100.0)
Platelets: 258 10*3/uL (ref 150.0–400.0)
RBC: 4.85 Mil/uL (ref 3.87–5.11)
RDW: 13.3 % (ref 11.5–15.5)
WBC: 9.8 10*3/uL (ref 4.0–10.5)

## 2020-09-10 LAB — COMPREHENSIVE METABOLIC PANEL
ALT: 10 U/L (ref 0–35)
AST: 13 U/L (ref 0–37)
Albumin: 4.5 g/dL (ref 3.5–5.2)
Alkaline Phosphatase: 53 U/L (ref 39–117)
BUN: 12 mg/dL (ref 6–23)
CO2: 26 mEq/L (ref 19–32)
Calcium: 9.3 mg/dL (ref 8.4–10.5)
Chloride: 105 mEq/L (ref 96–112)
Creatinine, Ser: 0.8 mg/dL (ref 0.40–1.20)
GFR: 101.13 mL/min (ref >60.00)
Glucose, Bld: 79 mg/dL (ref 70–99)
Potassium: 4.1 mEq/L (ref 3.5–5.1)
Sodium: 139 mEq/L (ref 135–145)
Total Bilirubin: 0.6 mg/dL (ref 0.2–1.2)
Total Protein: 7.1 g/dL (ref 6.0–8.3)

## 2020-09-10 LAB — HIV ANTIBODY (ROUTINE TESTING W REFLEX): HIV 1&2 Ab, 4th Generation: NONREACTIVE

## 2020-09-10 LAB — LIPID PANEL
Cholesterol: 181 mg/dL (ref 0–200)
HDL: 51.8 mg/dL (ref >39.00)
LDL Cholesterol: 107 mg/dL — ABNORMAL HIGH (ref 0–99)
NonHDL: 128.75
Total CHOL/HDL Ratio: 3
Triglycerides: 109 mg/dL (ref 0.0–149.0)
VLDL: 21.8 mg/dL (ref 0.0–40.0)

## 2020-09-10 LAB — HEPATITIS C ANTIBODY
Hepatitis C Ab: NONREACTIVE
SIGNAL TO CUT-OFF: 0.02 (ref <1.00)

## 2020-09-10 LAB — HEMOGLOBIN A1C: Hgb A1c MFr Bld: 5.6 % (ref 4.6–6.5)

## 2020-09-20 ENCOUNTER — Encounter: Payer: Self-pay | Admitting: Certified Nurse Midwife

## 2020-09-20 ENCOUNTER — Ambulatory Visit (INDEPENDENT_AMBULATORY_CARE_PROVIDER_SITE_OTHER): Payer: 59 | Admitting: Certified Nurse Midwife

## 2020-09-20 ENCOUNTER — Other Ambulatory Visit: Payer: Self-pay

## 2020-09-20 VITALS — BP 116/78 | HR 65 | Ht 63.0 in | Wt 173.1 lb

## 2020-09-20 DIAGNOSIS — Z3046 Encounter for surveillance of implantable subdermal contraceptive: Secondary | ICD-10-CM | POA: Diagnosis not present

## 2020-09-20 NOTE — Patient Instructions (Signed)
Nexplanon Instructions After Insertion  Keep bandage clean and dry for 24 hours  May use ice/Tylenol/Ibuprofen for soreness or pain  If you develop fever, drainage or increased warmth from incision site-contact office immediately   

## 2020-09-20 NOTE — Progress Notes (Signed)
Karen Sanders is a 29 y.o. year old G50P0000 Caucasian female here for Nexplanon removal and reinsertion.  She was given informed consent for removal and reinsertion of her Nexplanon. Her Nexplanon was placed 3 yrs ago, Patient's last menstrual period was 08/23/2020 (exact date)...   Risks/benefits/side effects of Nexplanon have been discussed and her questions have been answered.  Specifically, a failure rate of 12/998 has been reported, with an increased failure rate if pt takes St. John's Wort and/or antiseizure medicaitons.  Karen Sanders is aware of the common side effect of irregular bleeding, which the incidence of decreases over time.  BP 116/78   Pulse 65   Ht 5\' 3"  (1.6 m)   Wt 173 lb 1 oz (78.5 kg)   LMP 08/23/2020 (Exact Date)   BMI 30.66 kg/m  Patient's last menstrual period was 08/23/2020 (exact date). No results found for this or any previous visit (from the past 24 hour(s)).   Appropriate time out taken. Nexplanon site identified.  Area prepped in usual sterile fashon. Two cc's of 2% lidocaine was used to anesthetize the area. A small stab incision was made right beside the implant on the distal portion.  The Nexplanon rod was grasped using hemostats and removed intact without difficulty.  The area was cleansed again with betadine and the Nexplanon was inserted per manufacturer's recommendations without difficulty.  Steri-strips and a pressure bandage was applied.  There was less than 3 cc blood loss. There were no complications.  The patient tolerated the procedure well.  She was instructed to keep the area clean and dry, remove pressure bandage in 24 hours, and keep insertion site covered with the steri-strips for 3-5 days.  She was given a card indicating date Nexplanon was inserted and date it needs to be removed.   Follow-up PRN problems.  08/25/2020, CNM

## 2020-10-20 ENCOUNTER — Encounter: Payer: Self-pay | Admitting: Certified Nurse Midwife

## 2020-10-20 ENCOUNTER — Other Ambulatory Visit (HOSPITAL_COMMUNITY)
Admission: RE | Admit: 2020-10-20 | Discharge: 2020-10-20 | Disposition: A | Discharge status: Home / Self Care | Payer: 59 | Source: Ambulatory Visit | Attending: Certified Nurse Midwife | Admitting: Certified Nurse Midwife

## 2020-10-20 ENCOUNTER — Other Ambulatory Visit: Payer: Self-pay

## 2020-10-20 ENCOUNTER — Ambulatory Visit (INDEPENDENT_AMBULATORY_CARE_PROVIDER_SITE_OTHER): Payer: 59 | Admitting: Certified Nurse Midwife

## 2020-10-20 VITALS — BP 98/71 | HR 67 | Ht 63.0 in | Wt 181.2 lb

## 2020-10-20 DIAGNOSIS — Z01419 Encounter for gynecological examination (general) (routine) without abnormal findings: Secondary | ICD-10-CM | POA: Insufficient documentation

## 2020-10-20 DIAGNOSIS — N898 Other specified noninflammatory disorders of vagina: Secondary | ICD-10-CM

## 2020-10-20 DIAGNOSIS — Z124 Encounter for screening for malignant neoplasm of cervix: Secondary | ICD-10-CM | POA: Insufficient documentation

## 2020-10-20 NOTE — Patient Instructions (Signed)
Preventive Care 21-27 Years Old, Female Preventive care refers to visits with your health care provider and lifestyle choices that can promote health and wellness. This includes:  A yearly physical exam. This may also be called an annual well check.  Regular dental visits and eye exams.  Immunizations.  Screening for certain conditions.  Healthy lifestyle choices, such as eating a healthy diet, getting regular exercise, not using drugs or products that contain nicotine and tobacco, and limiting alcohol use. What can I expect for my preventive care visit? Physical exam Your health care provider will check your:  Height and weight. This may be used to calculate body mass index (BMI), which tells if you are at a healthy weight.  Heart rate and blood pressure.  Skin for abnormal spots. Counseling Your health care provider may ask you questions about your:  Alcohol, tobacco, and drug use.  Emotional well-being.  Home and relationship well-being.  Sexual activity.  Eating habits.  Work and work environment.  Method of birth control.  Menstrual cycle.  Pregnancy history. What immunizations do I need?  Influenza (flu) vaccine  This is recommended every year. Tetanus, diphtheria, and pertussis (Tdap) vaccine  You may need a Td booster every 10 years. Varicella (chickenpox) vaccine  You may need this if you have not been vaccinated. Human papillomavirus (HPV) vaccine  If recommended by your health care provider, you may need three doses over 6 months. Measles, mumps, and rubella (MMR) vaccine  You may need at least one dose of MMR. You may also need a second dose. Meningococcal conjugate (MenACWY) vaccine  One dose is recommended if you are age 19-21 years and a first-year college student living in a residence hall, or if you have one of several medical conditions. You may also need additional booster doses. Pneumococcal conjugate (PCV13) vaccine  You may need  this if you have certain conditions and were not previously vaccinated. Pneumococcal polysaccharide (PPSV23) vaccine  You may need one or two doses if you smoke cigarettes or if you have certain conditions. Hepatitis A vaccine  You may need this if you have certain conditions or if you travel or work in places where you may be exposed to hepatitis A. Hepatitis B vaccine  You may need this if you have certain conditions or if you travel or work in places where you may be exposed to hepatitis B. Haemophilus influenzae type b (Hib) vaccine  You may need this if you have certain conditions. You may receive vaccines as individual doses or as more than one vaccine together in one shot (combination vaccines). Talk with your health care provider about the risks and benefits of combination vaccines. What tests do I need?  Blood tests  Lipid and cholesterol levels. These may be checked every 5 years starting at age 20.  Hepatitis C test.  Hepatitis B test. Screening  Diabetes screening. This is done by checking your blood sugar (glucose) after you have not eaten for a while (fasting).  Sexually transmitted disease (STD) testing.  BRCA-related cancer screening. This may be done if you have a family history of breast, ovarian, tubal, or peritoneal cancers.  Pelvic exam and Pap test. This may be done every 3 years starting at age 21. Starting at age 30, this may be done every 5 years if you have a Pap test in combination with an HPV test. Talk with your health care provider about your test results, treatment options, and if necessary, the need for more tests.   Follow these instructions at home: Eating and drinking   Eat a diet that includes fresh fruits and vegetables, whole grains, lean protein, and low-fat dairy.  Take vitamin and mineral supplements as recommended by your health care provider.  Do not drink alcohol if: ? Your health care provider tells you not to drink. ? You are  pregnant, may be pregnant, or are planning to become pregnant.  If you drink alcohol: ? Limit how much you have to 0-1 drink a day. ? Be aware of how much alcohol is in your drink. In the [place] one drink equals one 12 oz bottle of beer (355 mL), one 5 oz glass of wine (148 mL), or one 1 oz glass of hard liquor (44 mL). Lifestyle  Take daily care of your teeth and gums.  Stay active. Exercise for at least 30 minutes on 5 or more days each week.  Do not use any products that contain nicotine or tobacco, such as cigarettes, e-cigarettes, and chewing tobacco. If you need help quitting, ask your health care provider.  If you are sexually active, practice safe sex. Use a condom or other form of birth control (contraception) in order to prevent pregnancy and STIs (sexually transmitted infections). If you plan to become pregnant, see your health care provider for a preconception visit. What's next?  Visit your health care provider once a year for a well check visit.  Ask your health care provider how often you should have your eyes and teeth checked.  Stay up to date on all vaccines. This information is not intended to replace advice given to you by your health care provider. Make sure you discuss any questions you have with your health care provider. Document Revised: 08/01/2018 Document Reviewed: 08/01/2018 Elsevier Patient Education  2020 [employer].

## 2020-10-20 NOTE — Progress Notes (Signed)
GYNECOLOGY ANNUAL PREVENTATIVE CARE ENCOUNTER NOTE  History:     Karen Sanders is a 29 y.o. G0P0000 female here for a routine annual gynecologic exam.  Current complaints: vaginal odor, has history of BV.   Denies abnormal vaginal bleeding, discharge, pelvic pain, problems with intercourse or other gynecologic concerns.     Social Relationship:single, is sexually active Living:with roommate Work: English as a second language teacher, & [employer] PT Exercise: 2 x wk  Smoke/Alcohol/drug XBD:[id number]  Gynecologic History Patient's last menstrual period was 09/25/2020 (exact date). Contraception: Nexplanon Last Pap: 2018 Results were: normal  Last mammogram: n/a   The pregnancy intention screening data noted above was reviewed. Potential methods of contraception were discussed. The patient elected to proceed with Hormonal Implant.    Obstetric History OB History  Gravida Para Term Preterm AB Living  0 0 0 0 0 0  SAB TAB Ectopic Multiple Live Births  0 0 0 0 0    Past Medical History:  Diagnosis Date  . Allergy   . Hypertrophy of tonsils and adenoids   . Wears contact lenses     Past Surgical History:  Procedure Laterality Date  . birth control implant     . TONSILLECTOMY AND ADENOIDECTOMY N/A 03/15/2016   Procedure: TONSILLECTOMY AND ADENOIDECTOMY;  Surgeon: Bud Face, MD;  Location: [facility];  Service: ENT;  Laterality: N/A;  . WISDOM TOOTH EXTRACTION      Current Outpatient Medications on File Prior to Visit  Medication Sig Dispense Refill  . etonogestrel (NEXPLANON) 68 MG IMPL implant 68 mg.     No current facility-administered medications on file prior to visit.    Allergies  Allergen Reactions  . Amoxicillin Swelling    Social History:  reports that she has never smoked. She has never used smokeless tobacco. She reports current alcohol use. She reports that she does not use drugs.  Family History  Problem Relation Age of Onset  . Heart disease  Mother 69       triple bypass  . Diabetes Mother   . Hypertension Mother   . Thyroid disease Mother   . Cancer Maternal Grandmother        unsure what type  . Diabetes Paternal Grandmother   . Breast cancer Neg Hx   . Ovarian cancer Neg Hx   . Colon cancer Neg Hx     The following portions of the patient's history were reviewed and updated as appropriate: allergies, current medications, past family history, past medical history, past social history, past surgical history and problem list.  Review of Systems Pertinent items noted in HPI and remainder of comprehensive ROS otherwise negative.  Physical Exam:  BP 98/71   Pulse 67   Ht 5\' 3"  (1.6 m)   Wt 181 lb 3 oz (82.2 kg)   LMP 09/25/2020 (Exact Date)   BMI 32.10 kg/m  CONSTITUTIONAL: Well-developed, well-nourished female in no acute distress.  HENT:  Normocephalic, atraumatic, External right and left ear normal. Oropharynx is clear and moist EYES: Conjunctivae and EOM are normal. Pupils are equal, round, and reactive to light. No scleral icterus.  NECK: Normal range of motion, supple, no masses.  Normal thyroid.  SKIN: Skin is warm and dry. No rash noted. Not diaphoretic. No erythema. No pallor. MUSCULOSKELETAL: Normal range of motion. No tenderness.  No cyanosis, clubbing, or edema.  2+ distal pulses. NEUROLOGIC: Alert and oriented to person, place, and time. Normal reflexes, muscle tone coordination.  PSYCHIATRIC: Normal mood and affect.  Normal behavior. Normal judgment and thought content. CARDIOVASCULAR: Normal heart rate noted, regular rhythm RESPIRATORY: Clear to auscultation bilaterally. Effort and breath sounds normal, no problems with respiration noted. BREASTS: Symmetric in size. No masses, tenderness, skin changes, nipple drainage, or lymphadenopathy bilaterally.  ABDOMEN: Soft, no distention noted.  No tenderness, rebound or guarding.  PELVIC: Normal appearing external genitalia and urethral meatus; normal appearing  vaginal mucosa and cervix.  No abnormal discharge noted.  Odor noted. Pap smear obtained.  Normal uterine size, no other palpable masses, no uterine or adnexal tenderness.  .   Assessment and Plan:    1. Women's annual routine gynecological examination   Pap:Will follow up results of pap smear and manage accordingly. Mammogram :n/a  Labs: vaginal swab Refills:none Referral:none Routine preventative health maintenance measures emphasized. Please refer to After Visit Summary for other counseling recommendations.      Doreene Burke, CNM [facility],  [facility]

## 2020-10-22 LAB — CERVICOVAGINAL ANCILLARY ONLY
Bacterial Vaginitis (gardnerella): POSITIVE — AB
Candida Glabrata: NEGATIVE
Candida Vaginitis: NEGATIVE
Comment: NEGATIVE
Comment: NEGATIVE
Comment: NEGATIVE

## 2020-10-24 ENCOUNTER — Other Ambulatory Visit: Payer: Self-pay | Admitting: Certified Nurse Midwife

## 2020-10-24 MED ORDER — METRONIDAZOLE 500 MG PO TABS: 500.0000 mg | ORAL_TABLET | Freq: Two times a day (BID) | ORAL | 0 refills | Status: AC

## 2020-10-24 NOTE — Progress Notes (Signed)
Vaginal swab positive for BV, orders placed for treatment.   Kejon Feild, CNM  

## 2020-10-26 ENCOUNTER — Other Ambulatory Visit: Payer: Self-pay | Admitting: Certified Nurse Midwife

## 2020-10-26 LAB — CYTOLOGY - PAP: Diagnosis: NEGATIVE

## 2020-12-09 ENCOUNTER — Emergency Department
Admission: EM | Admit: 2020-12-09 | Discharge: 2020-12-09 | Disposition: A | Discharge status: Home / Self Care | Payer: 59 | Attending: Emergency Medicine | Admitting: Emergency Medicine

## 2020-12-09 ENCOUNTER — Other Ambulatory Visit: Payer: Self-pay

## 2020-12-09 DIAGNOSIS — Z20822 Contact with and (suspected) exposure to covid-19: Secondary | ICD-10-CM

## 2020-12-09 DIAGNOSIS — R509 Fever, unspecified: Secondary | ICD-10-CM | POA: Diagnosis present

## 2020-12-09 DIAGNOSIS — U071 COVID-19: Secondary | ICD-10-CM | POA: Insufficient documentation

## 2020-12-09 LAB — SARS CORONAVIRUS 2 (TAT 6-24 HRS): SARS Coronavirus 2: POSITIVE — AB

## 2020-12-09 MED ORDER — ONDANSETRON 4 MG PO TBDP: 4.0000 mg | ORAL_TABLET | Freq: Four times a day (QID) | ORAL | 0 refills | Status: DC | PRN

## 2020-12-09 MED ORDER — AZITHROMYCIN 250 MG PO TABS: ORAL_TABLET | ORAL | 0 refills | Status: DC

## 2020-12-09 MED ORDER — ACETAMINOPHEN 325 MG PO TABS
650.0000 mg | ORAL_TABLET | Freq: Once | ORAL | Status: AC | PRN
  Administered 2020-12-09: 650 mg via ORAL
  Filled 2020-12-09: qty 2

## 2020-12-09 NOTE — ED Triage Notes (Signed)
Pt to ED POV for chief complaint of sore throat, cough, body aches x2 days.  Reports covid exposure at work  Febrile 101.1 on arrival, took ibuprofen at 1000.  RR Even and unlabored, NAD noted.  States she is here for a COVID test

## 2020-12-09 NOTE — ED Provider Notes (Signed)
[facility] Emergency Department Provider Note ____________________________________________   Event Date/Time   First MD Initiated Contact with Patient 12/09/20 1251     (approximate)  I have reviewed the triage vital signs and the nursing notes.  HISTORY  Chief Complaint COVID sx  HPI Karen Sanders is a 30 y.o. female here for evaluation of possible Covid exposure  Patient reports she came in she would like to get a Covid test.  She is a exposures through work and for about the last 3 days has had fever sore throat congestion body aches.  Reports she has been taking some Tylenol and ibuprofen that is mildly helpful.  Multiple about 20 coworkers at work and tested positive for [employer] recently and she has been exposed to them  No chest pain no trouble breathing.  Is still up walking without difficulty, but reports she just feels fatigued and has tried to get a Covid test elsewhere but has been unable to find anywhere that can test her for the last few days   Past Medical History:  Diagnosis Date  . Allergy   . Hypertrophy of tonsils and adenoids   . Wears contact lenses     Patient Active Problem List   Diagnosis Date Noted  . Obesity (BMI 30.0-34.9) 09/09/2020  . Family history of early CAD 11/17/2019    Past Surgical History:  Procedure Laterality Date  . birth control implant     . TONSILLECTOMY AND ADENOIDECTOMY N/A 03/15/2016   Procedure: TONSILLECTOMY AND ADENOIDECTOMY;  Surgeon: Bud Face, MD;  Location: [facility];  Service: ENT;  Laterality: N/A;  . WISDOM TOOTH EXTRACTION      Prior to Admission medications   Medication Sig Start Date End Date Taking? Authorizing Provider  etonogestrel (NEXPLANON) 68 MG IMPL implant 68 mg.    [provider]    Allergies Amoxicillin  Family History  Problem Relation Age of Onset  . Heart disease Mother 5       triple bypass  . Diabetes Mother   . Hypertension  Mother   . Thyroid disease Mother   . Cancer Maternal Grandmother        unsure what type  . Diabetes Paternal Grandmother   . Breast cancer Neg Hx   . Ovarian cancer Neg Hx   . Colon cancer Neg Hx     Social History Social History   Tobacco Use  . Smoking status: Never Smoker  . Smokeless tobacco: Never Used  Vaping Use  . Vaping Use: Never used  Substance Use Topics  . Alcohol use: Yes    Alcohol/week: 0.0 standard drinks    Comment: once a year  . Drug use: No    Review of Systems Constitutional: Fevers chills and fatigue Eyes: No visual changes. ENT: Mild sore throat no trouble swallowing or speaking Cardiovascular: Denies chest pain. Respiratory: Denies shortness of breath. Gastrointestinal: No abdominal pain.   Genitourinary: Negative for dysuria. Musculoskeletal: Negative for back pain. Skin: Negative for rash. Neurological: Negative for headaches, areas of focal weakness or numbness.    ____________________________________________   PHYSICAL EXAM:  VITAL SIGNS: ED Triage Vitals  Enc Vitals Group     BP 12/09/20 1226 135/79     Pulse Rate 12/09/20 1226 99     Resp 12/09/20 1226 18     Temp 12/09/20 1226 (!) 101.1 F (38.4 C)     Temp Source 12/09/20 1226 Oral     SpO2 12/09/20 1226 98 %  Weight 12/09/20 1228 185 lb (83.9 kg)     Height 12/09/20 1228 5\' 2"  (1.575 m)     Head Circumference --      Peak Flow --      Pain Score 12/09/20 1228 8     Pain Loc --      Pain Edu? --      Excl. in GC? --     Constitutional: Alert and oriented. Well appearing and in no acute distress. Eyes: Conjunctivae are mildly injected bilateral. Head: Atraumatic. Nose: No congestion/rhinnorhea. Mouth/Throat: Mucous membranes are moist.  Mild posterior pharyngeal injection.  Tonsils absent bilaterally. Neck: No stridor.  Cardiovascular: Normal rate, regular rhythm. Grossly normal heart sounds.  Good peripheral circulation. Respiratory: Normal respiratory  effort.  No retractions. Lungs CTAB. Gastrointestinal: Soft and nontender.  Musculoskeletal: No lower extremity tenderness nor edema. Neurologic:  Normal speech and language. No gross focal neurologic deficits are appreciated.  Skin:  Skin is warm, dry and intact. No rash noted. Psychiatric: Mood and affect are normal. Speech and behavior are normal.  ____________________________________________   LABS (all labs ordered are listed, but only abnormal results are displayed)  Labs Reviewed  SARS CORONAVIRUS 2 (TAT 6-24 HRS)   ____________________________________________  EKG   ____________________________________________  RADIOLOGY   ____________________________________________   PROCEDURES  Procedure(s) performed: None  Procedures  Critical Care performed: No  ____________________________________________   INITIAL IMPRESSION / ASSESSMENT AND PLAN / ED COURSE  Pertinent labs & imaging results that were available during my care of the patient were reviewed by me and considered in my medical decision making (see chart for details).   Karen Sanders was evaluated in Emergency Department on 12/09/2020 for the symptoms described in the history of present illness. She was evaluated in the context of the global COVID-19 pandemic, which necessitated consideration that the patient might be at risk for infection with the SARS-CoV-2 virus that causes COVID-19. Institutional protocols and algorithms that pertain to the evaluation of patients at risk for COVID-19 are in a state of rapid change based on information released by regulatory bodies including the CDC and federal and state organizations. These policies and algorithms were followed during the patient's care in the ED.  Patient noted to be febrile, and has a high risk of exposure to Covid.  Based on her history and clinical assessment suspect COVID-19, will send test.  In the meantime patient will take action including  quarantine.  Discussed with her that we will send a Covid test, discussed careful return precautions, also prescribed azithromycin for potential upper respiratory symptoms or bronchitis as it is unclear if she has COVID-19 at this point.  Will await testing.  Return precautions and treatment recommendations and follow-up discussed with the patient who is agreeable with the plan.  Patient nontoxic well-appearing appropriate for careful return precautions which we discussed     ____________________________________________   FINAL CLINICAL IMPRESSION(S) / ED DIAGNOSES  Final diagnoses:  Fever, unspecified fever cause  Suspected COVID-19 virus infection        Note:  This document was prepared using Dragon voice recognition software and may include unintentional dictation errors       02/06/2021, MD 12/09/20 1327

## 2021-01-04 ENCOUNTER — Encounter: Payer: 59 | Admitting: Certified Nurse Midwife

## 2021-01-31 ENCOUNTER — Other Ambulatory Visit: Payer: Self-pay

## 2021-01-31 ENCOUNTER — Encounter: Payer: Self-pay | Admitting: Emergency Medicine

## 2021-01-31 ENCOUNTER — Ambulatory Visit (INDEPENDENT_AMBULATORY_CARE_PROVIDER_SITE_OTHER): Payer: 59

## 2021-01-31 ENCOUNTER — Ambulatory Visit: Payer: 59 | Admitting: Family Medicine

## 2021-01-31 ENCOUNTER — Ambulatory Visit
Admission: EM | Admit: 2021-01-31 | Discharge: 2021-01-31 | Disposition: A | Discharge status: Home / Self Care | Payer: 59 | Attending: Physician Assistant | Admitting: Physician Assistant

## 2021-01-31 DIAGNOSIS — R2242 Localized swelling, mass and lump, left lower limb: Secondary | ICD-10-CM | POA: Diagnosis not present

## 2021-01-31 DIAGNOSIS — S93402A Sprain of unspecified ligament of left ankle, initial encounter: Secondary | ICD-10-CM | POA: Diagnosis not present

## 2021-01-31 DIAGNOSIS — M25572 Pain in left ankle and joints of left foot: Secondary | ICD-10-CM

## 2021-01-31 DIAGNOSIS — W19XXXA Unspecified fall, initial encounter: Secondary | ICD-10-CM

## 2021-01-31 NOTE — Discharge Instructions (Signed)
SPRAIN: Imaging is negative for fractures. Stressed avoiding painful activities . Reviewed RICE guidelines. Use medications as directed, including NSAIDs. If no NSAIDs have been prescribed for you today, you may take Aleve or Motrin over the counter. May use Tylenol in between doses of NSAIDs.  If no improvement in the next 1-2 weeks, f/u with PCP or return to our office for reexamination, and please feel free to call or return at any time for any questions or concerns you may have and we will be happy to help you!

## 2021-01-31 NOTE — ED Triage Notes (Signed)
Patient in today c/o left ankle pain after falling 01/30/21. Patient states she was moving furniture and missed a step and fell with all her weight on her left ankle. Patient states she alternated ice and heat last night and took Ibuprofen.

## 2021-01-31 NOTE — ED Provider Notes (Signed)
[facility]    CSN: [id number] Arrival date & time: 01/31/21  0912      History   Chief Complaint Chief Complaint  Patient presents with  . Ankle Injury    Left DOI 01/30/21    HPI Karen Sanders is a 30 y.o. female presenting for left ankle pain and swelling following a fall yesterday.  Patient states that she twisted her ankle and her foot ended up inverting underneath her body.  She reports increased swelling states that she has iced it and taking ibuprofen for pain relief.  Patient says pain is about 6-7 out of 10.  Admits to pain on weightbearing, but is able to walk.  She admits to some numbness of the dorsal foot.  Patient denies any weakness.  She says she has never had any major injuries of this foot or ankle before.  No previous surgeries.  Patient states that she walks about 6-8 miles a day at work and is concerned about having to walk a lot the injured foot/ankle.  Patient does request a note for work for light duty.  She has no other complaints or concerns today.  HPI  Past Medical History:  Diagnosis Date  . Allergy   . Hypertrophy of tonsils and adenoids   . Wears contact lenses     Patient Active Problem List   Diagnosis Date Noted  . Obesity (BMI 30.0-34.9) 09/09/2020  . Family history of early CAD 11/17/2019    Past Surgical History:  Procedure Laterality Date  . birth control implant     . TONSILLECTOMY AND ADENOIDECTOMY N/A 03/15/2016   Procedure: TONSILLECTOMY AND ADENOIDECTOMY;  Surgeon: Bud Face, MD;  Location: [facility];  Service: ENT;  Laterality: N/A;  . WISDOM TOOTH EXTRACTION      OB History    Gravida  0   Para  0   Term  0   Preterm  0   AB  0   Living  0     SAB  0   IAB  0   Ectopic  0   Multiple  0   Live Births  0            Home Medications    Prior to Admission medications   Medication Sig Start Date End Date Taking? Authorizing Provider  etonogestrel (NEXPLANON) 68 MG IMPL  implant 68 mg.   Yes [provider]  azithromycin (ZITHROMAX Z-PAK) 250 MG tablet As prescribed on Z Pak packaging 12/09/20   Sharyn Creamer, MD  ondansetron (ZOFRAN ODT) 4 MG disintegrating tablet Take 1 tablet (4 mg total) by mouth every 6 (six) hours as needed for nausea or vomiting. 12/09/20   Sharyn Creamer, MD    Family History Family History  Problem Relation Age of Onset  . Heart disease Mother 9       triple bypass  . Diabetes Mother   . Hypertension Mother   . Thyroid disease Mother   . Other Father        unknown medical history  . Cancer Maternal Grandmother        unsure what type  . Diabetes Paternal Grandmother   . Breast cancer Neg Hx   . Ovarian cancer Neg Hx   . Colon cancer Neg Hx     Social History Social History   Tobacco Use  . Smoking status: Never Smoker  . Smokeless tobacco: Never Used  Vaping Use  . Vaping Use: Never used  Substance Use Topics  . Alcohol use: Yes    Alcohol/week: 0.0 standard drinks    Comment: once a year  . Drug use: No     Allergies   Amoxicillin   Review of Systems Review of Systems  Musculoskeletal: Positive for arthralgias, gait problem and joint swelling.  Skin: Negative for color change, rash and wound.  Neurological: Positive for numbness. Negative for weakness.  Hematological: Does not bruise/bleed easily.     Physical Exam Triage Vital Signs ED Triage Vitals  Enc Vitals Group     BP 01/31/21 0938 120/77     Pulse Rate 01/31/21 0938 70     Resp 01/31/21 0938 18     Temp 01/31/21 0938 98.2 F (36.8 C)     Temp Source 01/31/21 0938 Oral     SpO2 01/31/21 0938 100 %     Weight 01/31/21 0937 184 lb (83.5 kg)     Height 01/31/21 0937 5\' 2"  (1.575 m)     Head Circumference --      Peak Flow --      Pain Score 01/31/21 0937 6     Pain Loc --      Pain Edu? --      Excl. in GC? --    No data found.  Updated Vital Signs BP 120/77 (BP Location: Left Arm)   Pulse 70   Temp 98.2 F (36.8 C) (Oral)    Resp 18   Ht 5\' 2"  (1.575 m)   Wt 184 lb (83.5 kg)   SpO2 100%   BMI 33.65 kg/m        Physical Exam Vitals and nursing note reviewed.  Constitutional:      General: She is not in acute distress.    Appearance: Normal appearance. She is not ill-appearing or toxic-appearing.  HENT:     Head: Normocephalic and atraumatic.  Eyes:     General: No scleral icterus.       Right eye: No discharge.        Left eye: No discharge.     Conjunctiva/sclera: Conjunctivae normal.  Cardiovascular:     Rate and Rhythm: Normal rate and regular rhythm.     Pulses: Normal pulses.  Pulmonary:     Effort: Pulmonary effort is normal. No respiratory distress.  Musculoskeletal:     Cervical back: Neck supple.     Left ankle: Swelling (moderate swelling of the lateral ankle) present. No ecchymosis. Tenderness present over the lateral malleolus and ATF ligament. Decreased range of motion.  Skin:    General: Skin is dry.  Neurological:     General: No focal deficit present.     Mental Status: She is alert. Mental status is at baseline.     Motor: No weakness.     Gait: Gait abnormal.  Psychiatric:        Mood and Affect: Mood normal.        Behavior: Behavior normal.        Thought Content: Thought content normal.      UC Treatments / Results  Labs (all labs ordered are listed, but only abnormal results are displayed) Labs Reviewed - No data to display  EKG   Radiology DG Ankle Complete Left  Result Date: 01/31/2021 CLINICAL DATA:  Fall with twisting injury of left ankle. Initial encounter. EXAM: LEFT ANKLE COMPLETE - 3+ VIEW COMPARISON:  05/20/2005 FINDINGS: No acute fracture or dislocation identified. The ankle mortise demonstrates normal alignment. There is suggestion of  mild soft tissue swelling overlying the lateral malleolus. No significant arthropathy or bony lesions. IMPRESSION: Mild lateral soft tissue swelling.  No acute fracture identified. Electronically Signed   By: Irish Lack M.D.   On: 01/31/2021 10:07    Procedures Procedures (including critical care time)  Medications Ordered in UC Medications - No data to display  Initial Impression / Assessment and Plan / UC Course  I have reviewed the triage vital signs and the nursing notes.  Pertinent labs & imaging results that were available during my care of the patient were reviewed by me and considered in my medical decision making (see chart for details).   30 year old female presenting for left ankle pain and swelling following injury yesterday.  She does have moderate swelling and tenderness of the lateral ankle and of the lateral malleolus.  Antalgic gait.  X-rays obtained today and independently reviewed by me are negative for any fractures or acute abnormalities.  Reviewed results with patient and advised her she likely has an ankle sprain.  Patient placed in the Yuriy lace up ankle brace.  I did give patient a note for work for lighter duty for a couple of days.  She did not want a note to be out of work.  Advised ibuprofen and Tylenol as well as rice for pain relief.  Follow-up with [place] as needed.  Final Clinical Impressions(s) / UC Diagnoses   Final diagnoses:  Sprain of left ankle, unspecified ligament, initial encounter  Acute left ankle pain     Discharge Instructions     SPRAIN: Imaging is negative for fractures. Stressed avoiding painful activities . Reviewed RICE guidelines. Use medications as directed, including NSAIDs. If no NSAIDs have been prescribed for you today, you may take Aleve or Motrin over the counter. May use Tylenol in between doses of NSAIDs.  If no improvement in the next 1-2 weeks, f/u with PCP or return to our office for reexamination, and please feel free to call or return at any time for any questions or concerns you may have and we will be happy to help you!       ED Prescriptions    None     PDMP not reviewed this encounter.   Shirlee Latch, PA-C 01/31/21  1042

## 2021-03-08 ENCOUNTER — Other Ambulatory Visit: Payer: Self-pay | Admitting: *Deleted

## 2021-03-15 ENCOUNTER — Telehealth: Payer: Self-pay | Admitting: Certified Nurse Midwife

## 2021-03-15 NOTE — Telephone Encounter (Signed)
Attempt to call pt back , no answer, mail box full-unable to leave message.   Doreene Burke, CNM

## 2021-03-15 NOTE — Telephone Encounter (Signed)
Karen Sanders called in and states she has been having issues with pain in her pelvic area for about a month and just never made it to see anyone about it.  Patient states this morning while in the shower, she noticed the pain in the pelvic area started to hurt again and started to travel into her lower back.  Patient states her body is trembling and she feels nauseated.  I offered the patient an appointment with Pattricia Boss and patient stated she would like for me to have a nurse call her back.  Patient states if she doesn't hear anything she will just go to the ER.  Confirmed patient call back number [phone].

## 2021-03-23 ENCOUNTER — Encounter: Payer: Self-pay | Admitting: Certified Nurse Midwife

## 2021-04-08 ENCOUNTER — Encounter: Payer: Self-pay | Admitting: Certified Nurse Midwife

## 2021-04-12 ENCOUNTER — Ambulatory Visit: Payer: 59 | Admitting: Certified Nurse Midwife

## 2021-04-12 ENCOUNTER — Other Ambulatory Visit: Payer: Self-pay

## 2021-04-12 ENCOUNTER — Other Ambulatory Visit (HOSPITAL_COMMUNITY)
Admission: RE | Admit: 2021-04-12 | Discharge: 2021-04-12 | Disposition: A | Discharge status: Home / Self Care | Payer: 59 | Source: Ambulatory Visit | Attending: Certified Nurse Midwife | Admitting: Certified Nurse Midwife

## 2021-04-12 ENCOUNTER — Encounter: Payer: Self-pay | Admitting: Certified Nurse Midwife

## 2021-04-12 VITALS — BP 109/71 | HR 71 | Resp 16 | Ht 62.0 in | Wt 184.1 lb

## 2021-04-12 DIAGNOSIS — R102 Pelvic and perineal pain: Secondary | ICD-10-CM | POA: Insufficient documentation

## 2021-04-12 NOTE — Patient Instructions (Signed)
Pelvic Pain, Female Pelvic pain is pain in your lower belly (abdomen), below your belly button and between your hips. The pain may start suddenly (be acute), keep coming back (be recurring), or last a long time (become chronic). Pelvic pain that lasts longer than 6 months is called chronic pelvic pain. There are many causes of pelvic pain. Sometimes the cause of pelvic pain is not known. Follow these instructions at home:  Take over-the-counter and prescription medicines only as told by your doctor.  Rest as told by your doctor.  Do not have sex if it hurts.  Keep a journal of your pelvic pain. Write down: ? When the pain started. ? Where the pain is located. ? What seems to make the pain better or worse, such as food or your period (menstrual cycle). ? Any symptoms you have along with the pain.  Keep all follow-up visits as told by your doctor. This is important.   Contact a doctor if:  Medicine does not help your pain.  Your pain comes back.  You have new symptoms.  You have unusual discharge or bleeding from your vagina.  You have a fever or chills.  You are having trouble pooping (constipation).  You have blood in your pee (urine) or poop (stool).  Your pee smells bad.  You feel weak or light-headed. Get help right away if:  You have sudden pain that is very bad.  Your pain keeps getting worse.  You have very bad pain and also have any of these symptoms: ? A fever. ? Feeling sick to your stomach (nausea). ? Throwing up (vomiting). ? Being very sweaty.  You pass out (lose consciousness). Summary  Pelvic pain is pain in your lower belly (abdomen), below your belly button and between your hips.  There are many possible causes of pelvic pain.  Keep a journal of your pelvic pain. This information is not intended to replace advice given to you by your health care provider. Make sure you discuss any questions you have with your health care provider. Document  Revised: 05/08/2018 Document Reviewed: 05/08/2018 Elsevier Patient Education  2021 Elsevier Inc.  

## 2021-04-12 NOTE — Progress Notes (Signed)
GYN ENCOUNTER NOTE  Subjective:       Karen Sanders is a 30 y.o. G0P0000 female is here for gynecologic evaluation of the following issues:  1. Intermittent Pelvic pain for the past 2 months. She describes it as a tightness that starts in her back and radiates around the front , the pain lasts for 5-10 minutes. Pain is 5-8 on pain scale when it occurs. Pt state that her bleeding with nexplanon is irregular in that she has regular flow 3-5 days with spotting for 1-2 wks. She has taken ibuprofen and tylenol for the pain and it does help. She states that she is sexually active but not in the last few weeks. She is agreeable to testing for infections.    Gynecologic History Patient's last menstrual period was 03/18/2021. Contraception: Nexplanon Last Pap: 10/21/2011. Results were: normal Last mammogram: n/a .   Obstetric History OB History  Gravida Para Term Preterm AB Living  0 0 0 0 0 0  SAB IAB Ectopic Multiple Live Births  0 0 0 0 0    Past Medical History:  Diagnosis Date  . Allergy   . Hypertrophy of tonsils and adenoids   . Wears contact lenses     Past Surgical History:  Procedure Laterality Date  . birth control implant     . TONSILLECTOMY AND ADENOIDECTOMY N/A 03/15/2016   Procedure: TONSILLECTOMY AND ADENOIDECTOMY;  Surgeon: Bud Face, MD;  Location: [facility];  Service: ENT;  Laterality: N/A;  . WISDOM TOOTH EXTRACTION      Current Outpatient Medications on File Prior to Visit  Medication Sig Dispense Refill  . etonogestrel (NEXPLANON) 68 MG IMPL implant 68 mg.     No current facility-administered medications on file prior to visit.    Allergies  Allergen Reactions  . Amoxicillin Swelling    Social History   Socioeconomic History  . Marital status: Single    Spouse name: Not on file  . Number of children: Not on file  . Years of education: Not on file  . Highest education level: Not on file  Occupational History  . Not on file   Tobacco Use  . Smoking status: Never Smoker  . Smokeless tobacco: Never Used  Vaping Use  . Vaping Use: Never used  Substance and Sexual Activity  . Alcohol use: Yes    Alcohol/week: 0.0 standard drinks    Comment: once a year  . Drug use: No  . Sexual activity: Yes    Birth control/protection: Implant, Condom  Other Topics Concern  . 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[employer]      Family: Mom lives nearby - good relationship, has siblings - OK relationships - 2 older, 1 younger      Enjoys: spend time with god-daughter      Exercise: works 2 jobs   Diet: not good      IT sales professional belts: Yes    Guns: Yes  and secure   Safe in relationships: Yes    Social Determinants of Corporate investment banker Strain: Not on file  Food Insecurity: Not on file  Transportation Needs: Not on file  Physical Activity: Not on file  Stress: Not on file  Social Connections: Not on file  Intimate Partner Violence: Not on file    Family History  Problem Relation  Age of Onset  . Heart disease Mother 35       triple bypass  . Diabetes Mother   . Hypertension Mother   . Thyroid disease Mother   . Other Father        unknown medical history  . Cancer Maternal Grandmother        unsure what type  . Diabetes Paternal Grandmother   . Breast cancer Neg Hx   . Ovarian cancer Neg Hx   . Colon cancer Neg Hx     The following portions of the patient's history were reviewed and updated as appropriate: allergies, current medications, past family history, past medical history, past social history, past surgical history and problem list.  Review of Systems Review of Systems - Negative except as mentioned in HPI Review of Systems - General ROS: negative for - chills, fatigue, fever, hot flashes, malaise or night sweats Hematological and Lymphatic ROS: negative for - bleeding problems or  swollen lymph nodes Gastrointestinal ROS: negative for - abdominal pain, blood in stools, change in bowel habits and nausea/vomiting Musculoskeletal ROS: negative for - joint pain, muscle pain or muscular weakness Genito-Urinary ROS: negative for - change in menstrual cycle, dysmenorrhea, dyspareunia, dysuria, genital discharge, genital ulcers, hematuria, incontinence, irregular/heavy menses, nocturia/. Positive for  pelvic pain  Objective:   BP 109/71   Pulse 71   Resp 16   Ht 5\' 2"  (1.575 m)   Wt 184 lb 1.6 oz (83.5 kg)   LMP 03/18/2021   BMI 33.67 kg/m  CONSTITUTIONAL: Well-developed, well-nourished female in no acute distress.  HENT:  Normocephalic, atraumatic.  NECK: Normal range of motion, supple, no masses.  Normal thyroid.  SKIN: Skin is warm and dry. No rash noted. Not diaphoretic. No erythema. No pallor. NEUROLGIC: Alert and oriented to person, place, and time. PSYCHIATRIC: Normal mood and affect. Normal behavior. Normal judgment and thought content. CARDIOVASCULAR:Not Examined RESPIRATORY: Not Examined BREASTS: Not Examined ABDOMEN: Soft, non distended; Non tender.  No Organomegaly. PELVIC:  External Genitalia: Normal  BUS: Normal  Vagina: Normal, swab collected  Cervix: Normal  Uterus: Normal size, shape,consistency, mobile  Adnexa: Normal  RV: Normal   Bladder: Nontender MUSCULOSKELETAL: Normal range of motion. No tenderness.  No cyanosis, clubbing, or edema.     Assessment:   1. Pelvic pain - Cervicovaginal ancillary only - 03/20/2021 PELVIC COMPLETE WITH TRANSVAGINAL; Future     Plan:  Discussed nexplanon in relationship to irregular bleeding and menstrual cramps. Pt has concern that nexplanon may cause endometriosis and or ovarian cysts. Reassurance given that this is not the case. Discussed u/s to r/o possible causes of pain including fibroid , ovarian cysts, congestive pelvic syndrome and normal cramps. She is in agreement to plan. Will follow up with results.    [place], CNM

## 2021-04-14 LAB — CERVICOVAGINAL ANCILLARY ONLY
Bacterial Vaginitis (gardnerella): POSITIVE — AB
Candida Glabrata: NEGATIVE
Candida Vaginitis: NEGATIVE
Chlamydia: NEGATIVE
Comment: NEGATIVE
Comment: NEGATIVE
Comment: NEGATIVE
Comment: NEGATIVE
Comment: NEGATIVE
Comment: NORMAL
Neisseria Gonorrhea: NEGATIVE
Trichomonas: NEGATIVE

## 2021-04-16 ENCOUNTER — Other Ambulatory Visit: Payer: Self-pay | Admitting: Certified Nurse Midwife

## 2021-04-16 MED ORDER — METRONIDAZOLE 500 MG PO TABS: 500.0000 mg | ORAL_TABLET | Freq: Two times a day (BID) | ORAL | 0 refills | Status: AC

## 2021-04-16 NOTE — Progress Notes (Signed)
Vaginal swab positive for BV, orders placed for treatment. Pt notified via my chart.   Doreene Burke, CNM

## 2021-07-05 NOTE — Telephone Encounter (Signed)
Pt to be scheduled to be seen with annie.

## 2021-07-06 ENCOUNTER — Other Ambulatory Visit: Payer: Self-pay

## 2021-07-06 ENCOUNTER — Ambulatory Visit: Payer: 59 | Admitting: Certified Nurse Midwife

## 2021-07-06 ENCOUNTER — Encounter: Payer: Self-pay | Admitting: Certified Nurse Midwife

## 2021-07-06 ENCOUNTER — Other Ambulatory Visit (HOSPITAL_COMMUNITY)
Admission: RE | Admit: 2021-07-06 | Discharge: 2021-07-06 | Disposition: A | Discharge status: Home / Self Care | Payer: 59 | Source: Ambulatory Visit | Attending: Certified Nurse Midwife | Admitting: Certified Nurse Midwife

## 2021-07-06 VITALS — BP 112/72 | HR 78 | Ht 62.0 in | Wt 183.8 lb

## 2021-07-06 DIAGNOSIS — N898 Other specified noninflammatory disorders of vagina: Secondary | ICD-10-CM | POA: Diagnosis not present

## 2021-07-06 NOTE — Patient Instructions (Signed)

## 2021-07-06 NOTE — Progress Notes (Signed)
GYN ENCOUNTER NOTE  Subjective:       Karen Sanders is a 30 y.o. G0P0000 female is here for gynecologic evaluation of the following issues:  1. Increased vaginal discharge with odor. States it is white and has particulate chucks at times. She started seeing this last month with her period. She has not tried any over the counter medications. She denies potential for STD and declines vaginal STD testing..     Gynecologic History Patient's last menstrual period was 06/18/2021 (exact date). Contraception: Nexplanon Last Pap: 10/20/2020. Results were: normal Last mammogram: n/a .   Obstetric History OB History  Gravida Para Term Preterm AB Living  0 0 0 0 0 0  SAB IAB Ectopic Multiple Live Births  0 0 0 0 0    Past Medical History:  Diagnosis Date   Allergy    Hypertrophy of tonsils and adenoids    Wears contact lenses     Past Surgical History:  Procedure Laterality Date   birth control implant      TONSILLECTOMY AND ADENOIDECTOMY N/A 03/15/2016   Procedure: TONSILLECTOMY AND ADENOIDECTOMY;  Surgeon: Bud Face, MD;  Location: [facility];  Service: ENT;  Laterality: N/A;   WISDOM TOOTH EXTRACTION      Current Outpatient Medications on File Prior to Visit  Medication Sig Dispense Refill   etonogestrel (NEXPLANON) 68 MG IMPL implant 68 mg.     No current facility-administered medications on file prior to visit.    Allergies  Allergen Reactions   Amoxicillin Swelling    Social History   Socioeconomic History   Marital status: Single    Spouse name: Not on file   Number of children: Not on file   Years of education: Not on file   Highest education level: Not on file  Occupational History   Not on file  Tobacco Use   Smoking status: Never   Smokeless tobacco: Never  Vaping Use   Vaping Use: Never used  Substance and Sexual Activity   Alcohol use: Not Currently    Comment: once a year   Drug use: No   Sexual activity: Yes    Birth  control/protection: Implant, Condom  Other Topics Concern   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[employer]      Family: Mom lives nearby - good relationship, has siblings - OK relationships - 2 older, 1 younger      Enjoys: spend time with god-daughter      Exercise: works 2 jobs   Diet: not good      IT sales professional belts: Yes    Guns: Yes  and secure   Safe in relationships: Yes    Social Determinants of Corporate investment banker Strain: Not on file  Food Insecurity: Not on file  Transportation Needs: Not on file  Physical Activity: Not on file  Stress: Not on file  Social Connections: Not on file  Intimate Partner Violence: Not on file    Family History  Problem Relation Age of Onset   Heart disease Mother 5       triple bypass   Diabetes Mother    Hypertension Mother    Thyroid disease Mother    Other Father        unknown medical history   Cancer Maternal Grandmother  unsure what type   Diabetes Paternal Grandmother    Breast cancer Neg Hx    Ovarian cancer Neg Hx    Colon cancer Neg Hx     The following portions of the patient's history were reviewed and updated as appropriate: allergies, current medications, past family history, past medical history, past social history, past surgical history and problem list.  Review of Systems Review of Systems - Negative except as mentioned in hpi Review of Systems - General ROS: negative for - chills, fatigue, fever, hot flashes, malaise or night sweats Hematological and Lymphatic ROS: negative for - bleeding problems or swollen lymph nodes Gastrointestinal ROS: negative for - abdominal pain, blood in stools, change in bowel habits and nausea/vomiting Musculoskeletal ROS: negative for - joint pain, muscle pain or muscular weakness Genito-Urinary ROS: negative for - change in menstrual cycle, dysmenorrhea,  dyspareunia, dysuria, genital discharge, genital ulcers, hematuria, incontinence, irregular/heavy menses, nocturia or pelvic pain. Positived for vaginal discharge with odor  Objective:   BP 112/72   Pulse 78   Ht 5\' 2"  (1.575 m)   Wt 183 lb 12.8 oz (83.4 kg)   LMP 06/18/2021 (Exact Date)   BMI 33.62 kg/m  CONSTITUTIONAL: Well-developed, well-nourished female in no acute distress.  HENT:  Normocephalic, atraumatic.  NECK: Normal range of motion, supple, no masses.  Normal thyroid.  SKIN: Skin is warm and dry. No rash noted. Not diaphoretic. No erythema. No pallor. NEUROLGIC: Alert and oriented to person, place, and time. PSYCHIATRIC: Normal mood and affect. Normal behavior. Normal judgment and thought content. CARDIOVASCULAR:Not Examined RESPIRATORY: Not Examined BREASTS: Not Examined ABDOMEN: Soft, non distended; Non tender.  No Organomegaly. PELVIC:  External Genitalia: Normal  BUS: Normal  Vagina: Normal  Cervix: Normal, white discharge, no odor noted on exam. Small amount of particulate matter.   Uterus: Normal size, shape,consistency, mobile  Adnexa: Normal  RV: Normal   Bladder: Nontender MUSCULOSKELETAL: Normal range of motion. No tenderness.  No cyanosis, clubbing, or edema.     Assessment:   Vaginal odor Vaginal discharge   Plan:   Pt has a history of recurrent BV infections. Discussed self help measures and recommend use of boric acid suppository or Replens for ph balance. Sample of boric acid given today. Swab collected. Will follow up with results. Return PRN of for annual exam.   06/20/2021, CNM

## 2021-07-07 LAB — CERVICOVAGINAL ANCILLARY ONLY
Bacterial Vaginitis (gardnerella): NEGATIVE
Candida Glabrata: NEGATIVE
Candida Vaginitis: POSITIVE — AB
Comment: NEGATIVE
Comment: NEGATIVE
Comment: NEGATIVE

## 2021-07-08 ENCOUNTER — Other Ambulatory Visit: Payer: Self-pay | Admitting: Certified Nurse Midwife

## 2021-07-08 MED ORDER — FLUCONAZOLE 150 MG PO TABS: 150.0000 mg | ORAL_TABLET | Freq: Once | ORAL | 0 refills | Status: AC

## 2021-08-23 ENCOUNTER — Other Ambulatory Visit: Payer: Self-pay

## 2021-08-23 ENCOUNTER — Ambulatory Visit: Payer: Self-pay | Admitting: Physician Assistant

## 2021-08-23 DIAGNOSIS — Z113 Encounter for screening for infections with a predominantly sexual mode of transmission: Secondary | ICD-10-CM

## 2021-08-23 LAB — WET PREP FOR TRICH, YEAST, CLUE
Trichomonas Exam: NEGATIVE
Yeast Exam: NEGATIVE

## 2021-08-23 NOTE — Progress Notes (Signed)
Pt here for STD screening.  Wet mount results reviewed and no treatment required.  Pt declined condoms. Berdie Ogren, RN

## 2021-08-24 ENCOUNTER — Encounter: Payer: Self-pay | Admitting: Physician Assistant

## 2021-08-24 NOTE — Progress Notes (Signed)
[facility] Department STI clinic/screening visit  Subjective:  Karen Sanders is a 30 y.o. female being seen today for an STI screening visit. The patient reports they do not have symptoms.  Patient reports that they do not desire a pregnancy in the next year.   They reported they are not interested in discussing contraception today.  Patient's last menstrual period was 07/27/2021 (exact date).   Patient has the following medical conditions:   Patient Active Problem List   Diagnosis Date Noted   Obesity (BMI 30.0-34.9) 09/09/2020   Family history of early CAD 11/17/2019    Chief Complaint  Patient presents with   SEXUALLY TRANSMITTED DISEASE    Screening    HPI  Patient reports that she is not having any symptoms but would like a screening today.  Denies chronic conditions, surgeries and regular medicines.  LMP was 07/27/2021 and using Nexplanon as BCM.  States last HIV test was 1-2 years ago  and last pap was 10/2020.   See flowsheet for further details and programmatic requirements.    The following portions of the patient's history were reviewed and updated as appropriate: allergies, current medications, past medical history, past social history, past surgical history and problem list.  Objective:  There were no vitals filed for this visit.  Physical Exam Constitutional:      General: She is not in acute distress.    Appearance: Normal appearance.  HENT:     Head: Normocephalic and atraumatic.     Comments: No nits,lice, or hair loss. No cervical, supraclavicular or axillary adenopathy.     Mouth/Throat:     Mouth: Mucous membranes are moist.     Pharynx: Oropharynx is clear. No oropharyngeal exudate or posterior oropharyngeal erythema.  Eyes:     Conjunctiva/sclera: Conjunctivae normal.  Pulmonary:     Effort: Pulmonary effort is normal.  Abdominal:     Palpations: Abdomen is soft. There is no mass.     Tenderness: There is no abdominal  tenderness. There is no guarding or rebound.  Genitourinary:    General: Normal vulva.     Rectum: Normal.     Comments: External genitalia/pubic area without nits, lice, edema, erythema, lesions and inguinal adenopathy. Vagina with normal mucosa and discharge. Cervix without visible lesions. Uterus firm, mobile, nt, no masses, no CMT, no adnexal tenderness or fullness.  Musculoskeletal:     Cervical back: Neck supple. No tenderness.  Skin:    General: Skin is warm and dry.     Findings: No bruising, erythema, lesion or rash.  Neurological:     Mental Status: She is alert and oriented to person, place, and time.  Psychiatric:        Mood and Affect: Mood normal.        Behavior: Behavior normal.        Thought Content: Thought content normal.        Judgment: Judgment normal.     Assessment and Plan:  Karen Sanders is a 30 y.o. female presenting to the [facility] Department for STI screening  1. Screening for STD (sexually transmitted disease) Patient into clinic without symptoms. Rec condoms with all sex. Await test results.  Counseled that RN will call if needs to RTC for treatment once results are back.  - WET PREP FOR TRICH, YEAST, CLUE - Chlamydia/Gonorrhea [facility] - HIV [place] - Syphilis [facility], [facility]     No follow-ups on file.  Future Appointments  Date Time Provider Department Center  10/17/2021  2:30 PM Doreene Burke, CNM [facility] None    Marylynn Pearson [place], [place]

## 2021-09-20 ENCOUNTER — Ambulatory Visit: Payer: 59 | Admitting: Family Medicine

## 2021-09-20 ENCOUNTER — Encounter: Payer: Self-pay | Admitting: Family Medicine

## 2021-09-20 ENCOUNTER — Other Ambulatory Visit: Payer: Self-pay

## 2021-09-20 VITALS — BP 100/70 | HR 70 | Temp 97.0°F | Ht 62.0 in | Wt 185.1 lb

## 2021-09-20 DIAGNOSIS — F411 Generalized anxiety disorder: Secondary | ICD-10-CM | POA: Insufficient documentation

## 2021-09-20 DIAGNOSIS — F321 Major depressive disorder, single episode, moderate: Secondary | ICD-10-CM

## 2021-09-20 DIAGNOSIS — F329 Major depressive disorder, single episode, unspecified: Secondary | ICD-10-CM | POA: Insufficient documentation

## 2021-09-20 MED ORDER — ESCITALOPRAM OXALATE 10 MG PO TABS: 10.0000 mg | ORAL_TABLET | Freq: Every day | ORAL | 1 refills | Status: DC

## 2021-09-20 NOTE — Assessment & Plan Note (Signed)
GAD and PHQ-9 elevated. Though pt notes predominate anxiety symptoms. Discussed option of therapy alone but pt works 75 hours a week and this is not possible. Sister tolerated Lexapro so will start with lexapro 10 mg. Discussed risks/side effects. Return 6 weeks for check-in (virutal OK).

## 2021-09-20 NOTE — Progress Notes (Signed)
Subjective:     Karen Sanders is a 30 y.o. 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 - has noticed she has been easily irritated - for months - does not feel down about herself  Review of Systems   Social History   Tobacco Use  Smoking Status Never  Smokeless Tobacco Never        Objective:    BP Readings from Last 3 Encounters:  09/20/21 100/70  07/06/21 112/72  04/12/21 109/71   Wt Readings from Last 3 Encounters:  09/20/21 185 lb 2 oz (84 kg)  07/06/21 183 lb 12.8 oz (83.4 kg)  04/12/21 184 lb 1.6 oz (83.5 kg)    BP 100/70   Pulse 70   Temp (!) 97 F (36.1 C) (Temporal)   Ht 5\' 2"  (1.575 m)   Wt 185 lb 2 oz (84 kg)   SpO2 99%   BMI 33.86 kg/m    Physical Exam Constitutional:      General: She is not in acute distress.    Appearance: She is well-developed. She is not diaphoretic.  HENT:     Right Ear: External ear normal.     Left Ear: External ear normal.  Eyes:     Conjunctiva/sclera: Conjunctivae normal.  Cardiovascular:     Rate and Rhythm: Normal rate and regular rhythm.  Pulmonary:     Effort: Pulmonary effort is normal. No respiratory distress.     Breath sounds: Normal breath sounds. No wheezing.  Musculoskeletal:     Cervical back: Neck supple.  Skin:    General: Skin is warm and dry.     Capillary Refill: Capillary refill takes less than 2 seconds.  Neurological:     Mental Status: She is alert. Mental status is at baseline.  Psychiatric:        Mood and Affect: Mood normal.        Behavior: Behavior normal.   Depression screen [facility] 2/9 09/20/2021 04/12/2021 09/09/2020  Decreased Interest 2 0 0  Down, Depressed, Hopeless 0 0 0  PHQ - 2 Score 2 0 0  Altered sleeping 3 - -  Tired, decreased energy 3 -  -  Change in appetite 3 - -  Feeling bad or failure about yourself  3 - -  Trouble concentrating 2 - -  Moving slowly or fidgety/restless 2 - -  Suicidal thoughts 0 - -  PHQ-9 Score 18 - -  Difficult doing work/chores Somewhat difficult - -   GAD 7 : Generalized Anxiety Score 09/20/2021  Nervous, Anxious, on Edge 2  Control/stop worrying 3  Worry too much - different things 3  Trouble relaxing 3  Restless 3  Easily annoyed or irritable 3  Afraid - awful might happen 1  Total GAD 7 Score 18  Anxiety Difficulty Somewhat difficult           Assessment & Plan:   Problem List Items Addressed This Visit       Other   Generalized anxiety disorder - Primary    GAD and PHQ-9 elevated. Though pt notes predominate anxiety symptoms. Discussed option of therapy alone but pt works 75 hours a week and this is not possible. Sister tolerated Lexapro so will  start with lexapro 10 mg. Discussed risks/side effects. Return 6 weeks for check-in (virutal OK).       Relevant Medications   escitalopram (LEXAPRO) 10 MG tablet   Major depressive disorder, single episode    Start lexapro 10 mg per anxiety plan. Return 6 weeks.       Relevant Medications   escitalopram (LEXAPRO) 10 MG tablet     Return in about 6 weeks (around 11/01/2021) for for virtual w/ any provider available.  Lynnda Child, MD  This visit occurred during the SARS-CoV-2 public health emergency.  Safety protocols were in place, including screening questions prior to the visit, additional usage of staff PPE, and extensive cleaning of exam room while observing appropriate contact time as indicated for disinfecting solutions.

## 2021-09-20 NOTE — Assessment & Plan Note (Signed)
Start lexapro 10 mg per anxiety plan. Return 6 weeks.

## 2021-09-20 NOTE — Patient Instructions (Addendum)
If after 3 weeks - tolerating but no major difference send message and can increase to 20 mg    You are going to start a new antidepressant medication.   One of the risks of this medication is increase in suicidal thoughts.   Your suicide Action plan is as follows:  1) Call family or friend 2) Call the Suicide Hotline 4505991110 which is available 24 hours 3) Call the Clinic    The most common side effect is stomach upset. If this happens it means the medication is working. It should get better in 1-3 weeks.   Medication for depression and anxiety often takes 6-8 weeks to have a noticeable difference so stick with it. Also the best way for recovery is taking medication and seeing a therapist -- this is so important.    How to help anxiety - without medication.   1) Regular Exercise - walking, jogging, cycling, dancing, strength training --> Yoga has been shown in research to reduce depression and anxiety -- with even just one hour long session per week  2)  Begin a Mindfulness/Meditation practice -- this can take a little as 3 minutes and is helpful for all kinds of mood issues -- You can find resources in books -- Or you can download apps like  ---- Headspace App (which currently has free content called "Weathering the Storm") ---- Calm (which has a few free options)  ---- Insignt Timer ---- Stop, Breathe & Think  # With each of these Apps - you should decline the "start free trial" offer and as you search through the App should be able to access some of their free content. You can also chose to pay for the content if you find one that works well for you.   # Many of them also offer sleep specific content which may help with insomnia  3) Healthy Diet -- Avoid or decrease Caffeine -- Avoid or decrease Alcohol -- Drink plenty of water, have a balanced diet -- Avoid cigarettes and marijuana (as well as other recreational drugs)  4) Consider contacting a professional  therapist  -- [facility] is one option. Call [phone] -- Or you can check out www.psychologytoday.com -- you can read bios of therapists and see if they accept insurance -- Check with your insurance to see if you have coverage and who may take your insurance

## 2021-10-17 ENCOUNTER — Encounter: Payer: Self-pay | Admitting: Certified Nurse Midwife

## 2021-10-17 ENCOUNTER — Other Ambulatory Visit: Payer: Self-pay

## 2021-10-17 ENCOUNTER — Ambulatory Visit (INDEPENDENT_AMBULATORY_CARE_PROVIDER_SITE_OTHER): Payer: 59 | Admitting: Certified Nurse Midwife

## 2021-10-17 VITALS — BP 124/83 | HR 73 | Ht 62.0 in | Wt 185.0 lb

## 2021-10-17 DIAGNOSIS — Z01419 Encounter for gynecological examination (general) (routine) without abnormal findings: Secondary | ICD-10-CM

## 2021-10-17 NOTE — Progress Notes (Signed)
GYNECOLOGY ANNUAL PREVENTATIVE CARE ENCOUNTER NOTE  History:     Karen Sanders is a 30 y.o. G0P0000 female here for a routine annual gynecologic exam.  Current complaints: none.   Denies abnormal vaginal bleeding, discharge, pelvic pain, problems with intercourse or other gynecologic concerns.     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 [employer] PT Exercise: 2 x wk, a lot of walking at work  Smoke/Alcohol/drug use: denies use  Gynecologic History Patient's last menstrual period was 10/01/2021 (exact date). Contraception: Nexplanon Last Pap: 10/20/2020. Results were: normal  Last mammogram: n/a .    Upstream - 10/17/21 1437       Pregnancy Intention Screening   Does the patient want to become pregnant in the next year? No    Does the patient's partner want to become pregnant in the next year? No    Would the patient like to discuss contraceptive options today? No            The pregnancy intention screening data noted above was reviewed. Potential methods of contraception were discussed. The patient elected to proceed with nepxlanon.   Obstetric History OB History  Gravida Para Term Preterm AB Living  0 0 0 0 0 0  SAB IAB Ectopic Multiple Live Births  0 0 0 0 0    Past Medical History:  Diagnosis Date   Allergy    Hypertrophy of tonsils and adenoids    Wears contact lenses     Past Surgical History:  Procedure Laterality Date   birth control implant      TONSILLECTOMY AND ADENOIDECTOMY N/A 03/15/2016   Procedure: TONSILLECTOMY AND ADENOIDECTOMY;  Surgeon: Bud Face, MD;  Location: [facility];  Service: ENT;  Laterality: N/A;   WISDOM TOOTH EXTRACTION      Current Outpatient Medications on File Prior to Visit  Medication Sig Dispense Refill   escitalopram (LEXAPRO) 10 MG tablet Take 1 tablet (10 mg total) by mouth daily. 30 tablet 1   etonogestrel (NEXPLANON) 68 MG IMPL implant 68 mg.      No current facility-administered medications on file prior to visit.    Allergies  Allergen Reactions   Amoxicillin Swelling    Social History:  reports that she has never smoked. She has never used smokeless tobacco. She reports that she does not currently use alcohol. She reports that she does not use drugs.  Family History  Problem Relation Age of Onset   Heart disease Mother 50       triple bypass   Diabetes Mother    Hypertension Mother    Thyroid disease Mother    Other Father        unknown medical history   Cancer Maternal Grandmother        unsure what type   Diabetes Paternal Grandmother    Breast cancer Neg Hx    Ovarian cancer Neg Hx    Colon cancer Neg Hx     The following portions of the patient's history were reviewed and updated as appropriate: allergies, current medications, past family history, past medical history, past social history, past surgical history and problem list.  Review of Systems Pertinent items noted in HPI and remainder of comprehensive ROS otherwise negative.  Physical Exam:  BP 124/83   Pulse 73   Ht 5\' 2"  (1.575 m)   Wt 185 lb (83.9 kg)   LMP 10/01/2021 (Exact Date)   BMI 33.84 kg/m  CONSTITUTIONAL:  Well-developed, well-nourished female in no acute distress.  HENT:  Normocephalic, atraumatic, External right and left ear normal. Oropharynx is clear and moist EYES: Conjunctivae and EOM are normal. Pupils are equal, round, and reactive to light. No scleral icterus.  NECK: Normal range of motion, supple, no masses.  Normal thyroid.  SKIN: Skin is warm and dry. No rash noted. Not diaphoretic. No erythema. No pallor. Multiple tattoos  MUSCULOSKELETAL: Normal range of motion. No tenderness.  No cyanosis, clubbing, or edema.  2+ distal pulses. NEUROLOGIC: Alert and oriented to person, place, and time. Normal reflexes, muscle tone coordination.  PSYCHIATRIC: Normal mood and affect. Normal behavior. Normal judgment and thought  content. CARDIOVASCULAR: Normal heart rate noted, regular rhythm RESPIRATORY: Clear to auscultation bilaterally. Effort and breath sounds normal, no problems with respiration noted. BREASTS: Symmetric in size. No masses, tenderness, skin changes, nipple drainage, or lymphadenopathy bilaterally. Bilateral nipple piercine  ABDOMEN: Soft, no distention noted.  No tenderness, rebound or guarding.  PELVIC: Normal appearing external genitalia and urethral meatus; normal appearing vaginal mucosa and cervix.  No abnormal discharge noted.  Pap smear not due.  Normal uterine size, no other palpable masses, no uterine or adnexal tenderness.  .   Assessment and Plan:    1. Well woman exam with routine gynecological exam  Pap: not due Mammogram : n/a  Labs: none Refills: none Declines flu  Referral: none Routine preventative health maintenance measures emphasized. Please refer to After Visit Summary for other counseling recommendations.      Doreene Burke, CNM [facility],  [facility]

## 2021-11-02 ENCOUNTER — Telehealth (INDEPENDENT_AMBULATORY_CARE_PROVIDER_SITE_OTHER): Payer: 59 | Admitting: Internal Medicine

## 2021-11-02 ENCOUNTER — Other Ambulatory Visit: Payer: Self-pay

## 2021-11-02 ENCOUNTER — Encounter: Payer: Self-pay | Admitting: Internal Medicine

## 2021-11-02 DIAGNOSIS — F411 Generalized anxiety disorder: Secondary | ICD-10-CM | POA: Diagnosis not present

## 2021-11-02 MED ORDER — DULOXETINE HCL 30 MG PO CPEP: 30.0000 mg | ORAL_CAPSULE | Freq: Every day | ORAL | 3 refills | Status: DC

## 2021-11-02 NOTE — Patient Instructions (Signed)
Please start the duloxetine 30mg  daily. If after 3 weeks, you are doing okay (no side effects), but the anxiety isn't better, increase to 60mg  (2 of the 30mg  daily) and let me know (I will send a new prescription)

## 2021-11-02 NOTE — Assessment & Plan Note (Signed)
Isolated anxiety--no depression now Sedation with the lexapro  She wants to try something else Will try duloxetine 30mg  daily. Plan to increase in 3 weeks if no side effects and anxiety isn't better Recheck 6 weeks

## 2021-11-02 NOTE — Progress Notes (Signed)
Subjective:    Patient ID: Karen Sanders, female    DOB: 1991-07-22, 29 y.o.   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 Cutting dose to 5mg  still didn't help Then she stopped it  Current Outpatient Medications on File Prior to Visit  Medication Sig Dispense Refill   etonogestrel (NEXPLANON) 68 MG IMPL implant 68 mg.     No current facility-administered medications on file prior to visit.    Allergies  Allergen Reactions   Amoxicillin Swelling    Past Medical History:  Diagnosis Date   Allergy    Hypertrophy of tonsils and adenoids    Wears contact lenses     Past Surgical History:  Procedure Laterality Date   birth control implant      TONSILLECTOMY AND ADENOIDECTOMY N/A 03/15/2016   Procedure: TONSILLECTOMY AND ADENOIDECTOMY;  Surgeon: 05/15/2016, MD;  Location: [facility];  Service: ENT;  Laterality: N/A;   WISDOM TOOTH EXTRACTION      Family History  Problem Relation Age of Onset   Heart disease Mother 25       triple bypass   Diabetes Mother    Hypertension Mother    Thyroid disease Mother    Other Father        unknown medical history   Cancer Maternal Grandmother        unsure what type   Diabetes Paternal Grandmother    Breast cancer Neg Hx    Ovarian cancer Neg Hx    Colon cancer Neg Hx      Social History   Socioeconomic History   Marital status: Single    Spouse name: Not on file   Number of children: Not on file   Years of education: Not on file   Highest education level: Not on file  Occupational History   Not on file  Tobacco Use   Smoking status: Never   Smokeless tobacco: Never  Vaping Use   Vaping Use: Never used  Substance and Sexual Activity   Alcohol use: Not Currently    Comment: once a year   Drug use: No   Sexual activity: Yes    Partners: Male    Birth control/protection: Implant, Condom  Other Topics Concern   Not on file  Social History Narrative   09/09/20   From: the area   Living: alone   Work: 11/09/20, 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[employer]      Family: Mom lives nearby - good relationship, has siblings - OK relationships - 2 older, 1 younger      Enjoys: spend time with god-daughter      Exercise: works 2 jobs   Diet: not good      Psychiatrist belts: Yes    Guns: Yes  and secure   Safe in relationships: Yes    Social Determinants of IT sales professional Strain:  Not on file  Food Insecurity: Not on file  Transportation Needs: Not on file  Physical Activity: Not on file  Stress: Not on file  Social Connections: Not on file  Intimate Partner Violence: Not on file   Review of Systems Not sleeping well---initiates well but wakes up (comes home late from second shift) Appetite is okay---eats once a day generally (no change).  Weight is stable    Objective:   Physical Exam Constitutional:      Appearance: Normal appearance.  Neurological:     Mental Status: She is alert.  Psychiatric:        Mood and Affect: Mood normal.        Behavior: Behavior normal.           Assessment & Plan:

## 2021-11-04 ENCOUNTER — Telehealth: Payer: Self-pay | Admitting: Internal Medicine

## 2021-11-04 NOTE — Telephone Encounter (Signed)
Called pt to make six week virtual visit. LVM with pt to call back to schedule appt.

## 2021-12-28 ENCOUNTER — Other Ambulatory Visit: Payer: Self-pay

## 2021-12-28 ENCOUNTER — Ambulatory Visit
Admission: EM | Admit: 2021-12-28 | Discharge: 2021-12-28 | Disposition: A | Discharge status: Home / Self Care | Payer: 59 | Attending: Medical Oncology | Admitting: Medical Oncology

## 2021-12-28 DIAGNOSIS — J069 Acute upper respiratory infection, unspecified: Secondary | ICD-10-CM | POA: Diagnosis not present

## 2021-12-28 DIAGNOSIS — J029 Acute pharyngitis, unspecified: Secondary | ICD-10-CM | POA: Insufficient documentation

## 2021-12-28 DIAGNOSIS — R519 Headache, unspecified: Secondary | ICD-10-CM | POA: Insufficient documentation

## 2021-12-28 DIAGNOSIS — U071 COVID-19: Secondary | ICD-10-CM | POA: Diagnosis not present

## 2021-12-28 MED ORDER — FLUTICASONE PROPIONATE 50 MCG/ACT NA SUSP: 2.0000 | Freq: Every day | NASAL | 0 refills | Status: DC

## 2021-12-28 MED ORDER — BENZONATATE 100 MG PO CAPS: 100.0000 mg | ORAL_CAPSULE | Freq: Three times a day (TID) | ORAL | 0 refills | Status: DC

## 2021-12-28 NOTE — ED Provider Notes (Addendum)
[facility]    CSN: [id number] Arrival date & time: 12/28/21  1407      History   Chief Complaint Chief Complaint  Patient presents with   Sore Throat   Headache   Chills    HPI Karen Sanders is a 31 y.o. female.   HPI  Cold Symptoms: Patient reports that they have had symptoms of sore throat, headache, chills for the past 5 days. Symptoms are stable. They deny SOB, chest pain, fever or vomiting. They have tried ibuprofen for symptoms. She does report that she tested positive for COVID-19 at home but employer asks for PCR confirmation.    Past Medical History:  Diagnosis Date   Allergy    Hypertrophy of tonsils and adenoids    Wears contact lenses     Patient Active Problem List   Diagnosis Date Noted   Generalized anxiety disorder 09/20/2021   Major depressive disorder, single episode 09/20/2021   Obesity (BMI 30.0-34.9) 09/09/2020   Family history of early CAD 11/17/2019    Past Surgical History:  Procedure Laterality Date   birth control implant      TONSILLECTOMY AND ADENOIDECTOMY N/A 03/15/2016   Procedure: TONSILLECTOMY AND ADENOIDECTOMY;  Surgeon: Bud Face, MD;  Location: [facility];  Service: ENT;  Laterality: N/A;   WISDOM TOOTH EXTRACTION      OB History     Gravida  0   Para  0   Term  0   Preterm  0   AB  0   Living  0      SAB  0   IAB  0   Ectopic  0   Multiple  0   Live Births  0            Home Medications    Prior to Admission medications   Medication Sig Start Date End Date Taking? Authorizing Provider  DULoxetine (CYMBALTA) 30 MG capsule Take 1 capsule (30 mg total) by mouth daily. 11/02/21   Karie Schwalbe, MD  etonogestrel (NEXPLANON) 68 MG IMPL implant 68 mg.    [provider]    Family History Family History  Problem Relation Age of Onset   Heart disease Mother 63       triple bypass   Diabetes Mother    Hypertension Mother    Thyroid disease Mother     Other Father        unknown medical history   Cancer Maternal Grandmother        unsure what type   Diabetes Paternal Grandmother    Breast cancer Neg Hx    Ovarian cancer Neg Hx    Colon cancer Neg Hx     Social History Social History   Tobacco Use   Smoking status: Never   Smokeless tobacco: Never  Vaping Use   Vaping Use: Never used  Substance Use Topics   Alcohol use: Not Currently    Comment: once a year   Drug use: No     Allergies   Amoxicillin and Lexapro [escitalopram]   Review of Systems Review of Systems  As stated above in HPI Physical Exam Triage Vital Signs ED Triage Vitals [12/28/21 1433]  Enc Vitals Group     BP 122/70     Pulse Rate 94     Resp 16     Temp 98.8 F (37.1 C)     Temp Source Oral     SpO2 100 %  Weight      Height      Head Circumference      Peak Flow      Pain Score      Pain Loc      Pain Edu?      Excl. in GC?    No data found.  Updated Vital Signs BP 122/70 (BP Location: Left Arm)    Pulse 94    Temp 98.8 F (37.1 C) (Oral)    Resp 16    SpO2 100%   Physical Exam Vitals and nursing note reviewed.  Constitutional:      General: She is not in acute distress.    Appearance: Normal appearance. She is not ill-appearing, toxic-appearing or diaphoretic.  HENT:     Head: Normocephalic and atraumatic.     Right Ear: Tympanic membrane normal.     Left Ear: Tympanic membrane normal.     Nose: Congestion and rhinorrhea present.     Mouth/Throat:     Mouth: Mucous membranes are moist.     Pharynx: Oropharynx is clear. No oropharyngeal exudate or posterior oropharyngeal erythema.  Eyes:     Extraocular Movements: Extraocular movements intact.     Conjunctiva/sclera: Conjunctivae normal.     Pupils: Pupils are equal, round, and reactive to light.  Cardiovascular:     Rate and Rhythm: Normal rate and regular rhythm.     Heart sounds: Normal heart sounds.  Pulmonary:     Effort: Pulmonary effort is normal.      Breath sounds: Normal breath sounds.  Musculoskeletal:     Cervical back: Normal range of motion and neck supple.  Lymphadenopathy:     Cervical: No cervical adenopathy.  Skin:    General: Skin is warm.  Neurological:     Mental Status: She is alert and oriented to person, place, and time.     UC Treatments / Results  Labs (all labs ordered are listed, but only abnormal results are displayed) Labs Reviewed  SARS CORONAVIRUS 2 (TAT 6-24 HRS)    EKG   Radiology No results found.  Procedures Procedures (including critical care time)  Medications Ordered in UC Medications - No data to display  Initial Impression / Assessment and Plan / UC Course  I have reviewed the triage vital signs and the nursing notes.  Pertinent labs & imaging results that were available during my care of the patient were reviewed by me and considered in my medical decision making (see chart for details).     New.  Symptoms definitely appear to be viral in nature.  COVID testing is pending given that she needs PCR testing. For now treating with tessalon, flonase, rest and hydration. Follow up as needed.    Final Clinical Impressions(s) / UC Diagnoses   Final diagnoses:  None   Discharge Instructions   None    ED Prescriptions   None    PDMP not reviewed this encounter.   Rushie Chestnut, PA-C 12/28/21 1550    Rushie Chestnut, PA-C 12/28/21 1551

## 2021-12-28 NOTE — ED Triage Notes (Signed)
Patient presents to Urgent Care with complaints of sore throat, HA, chills since Saturday. Treating symptoms with ibuprofen. Pt states she tested positive for COVID. Employer req. Her to obtain a PCR test.

## 2021-12-29 LAB — SARS CORONAVIRUS 2 (TAT 6-24 HRS): SARS Coronavirus 2: POSITIVE — AB

## 2022-03-13 ENCOUNTER — Ambulatory Visit: Payer: 59 | Admitting: Certified Nurse Midwife

## 2022-03-13 ENCOUNTER — Encounter: Payer: Self-pay | Admitting: Certified Nurse Midwife

## 2022-03-13 VITALS — BP 125/85 | HR 74 | Ht 62.0 in | Wt 193.5 lb

## 2022-03-13 DIAGNOSIS — Z3046 Encounter for surveillance of implantable subdermal contraceptive: Secondary | ICD-10-CM | POA: Diagnosis not present

## 2022-03-13 NOTE — Patient Instructions (Signed)

## 2022-03-13 NOTE — Progress Notes (Signed)
Karen Sanders is a 31 y.o. year old [id number] Caucasian female here for Nexplanon removal and reinsertion.  She was given informed consent for removal and reinsertion of her Nexplanon. Her Nexplanon was placed 1 yr ago. States she has had too much bleeding ,  ? ?BP 125/85   Pulse 74   Ht 5\' 2"  (1.575 m)   Wt 193 lb 8 oz (87.8 kg)   LMP 02/14/2022 (Exact Date)   BMI 35.39 kg/m?  ?Patient's last menstrual period was 02/14/2022 (exact date). ?No results found for this or any previous visit (from the past 24 hour(s)).  ? ?Appropriate time out taken. Nexplanon site identified.  Area prepped in usual sterile fashon. Two cc's of 2% lidocaine was used to anesthetize the area. A small stab incision was made right beside the implant on the distal portion.  The Nexplanon rod was grasped using hemostats and removed intact without difficulty.  Steri-strips and a pressure bandage was applied.  There was less than 3 cc blood loss. There were no complications.  The patient tolerated the procedure well. ? ?She was instructed to keep the area clean and dry, remove pressure bandage in 24 hours, and keep insertion site covered with the steri-strips for 3-5 days.  She will use condoms for birth control .  ? ?Follow-up PRN problems. ? ?02/16/2022, CNM  ?

## 2022-07-14 ENCOUNTER — Ambulatory Visit
Admission: EM | Admit: 2022-07-14 | Discharge: 2022-07-14 | Disposition: A | Discharge status: Home / Self Care | Payer: 59 | Attending: Emergency Medicine | Admitting: Emergency Medicine

## 2022-07-14 DIAGNOSIS — J069 Acute upper respiratory infection, unspecified: Secondary | ICD-10-CM | POA: Diagnosis present

## 2022-07-14 LAB — GROUP A STREP BY PCR: Group A Strep by PCR: NOT DETECTED

## 2022-07-14 MED ORDER — PROMETHAZINE-DM 6.25-15 MG/5ML PO SYRP: 5.0000 mL | ORAL_SOLUTION | Freq: Four times a day (QID) | ORAL | 0 refills | Status: DC | PRN

## 2022-07-14 MED ORDER — BENZONATATE 100 MG PO CAPS: 200.0000 mg | ORAL_CAPSULE | Freq: Three times a day (TID) | ORAL | 0 refills | Status: DC

## 2022-07-14 MED ORDER — IPRATROPIUM BROMIDE 0.06 % NA SOLN: 2.0000 | Freq: Four times a day (QID) | NASAL | 12 refills | Status: DC

## 2022-07-14 NOTE — ED Provider Notes (Signed)
[facility]    CSN: [id number] Arrival date & time: 07/14/22  6256      History   Chief Complaint Chief Complaint  Patient presents with   Sore Throat   Nasal Congestion   Otalgia    HPI Karen Sanders is a 31 y.o. female.   HPI  31 year old female here for evaluation respiratory complaints.  Patient reports that for the last 4 days she has been experiencing nasal congestion with thick intermittent nasal discharge, bilateral ear pain, sore throat with painful swallowing, and she reports she just began a nonproductive cough this morning.  She denies any fever, shortness of breath, or wheezing.  She has taken at home COVID test that was negative.  She does have a history of seasonal allergies and also recurrent strep throat.  She has had a tonsillectomy.  Past Medical History:  Diagnosis Date   Allergy    Hypertrophy of tonsils and adenoids    Wears contact lenses     Patient Active Problem List   Diagnosis Date Noted   Generalized anxiety disorder 09/20/2021   Major depressive disorder, single episode 09/20/2021   Obesity (BMI 30.0-34.9) 09/09/2020   Family history of early CAD 11/17/2019    Past Surgical History:  Procedure Laterality Date   birth control implant      TONSILLECTOMY AND ADENOIDECTOMY N/A 03/15/2016   Procedure: TONSILLECTOMY AND ADENOIDECTOMY;  Surgeon: Bud Face, MD;  Location: [facility];  Service: ENT;  Laterality: N/A;   WISDOM TOOTH EXTRACTION      OB History     Gravida  0   Para  0   Term  0   Preterm  0   AB  0   Living  0      SAB  0   IAB  0   Ectopic  0   Multiple  0   Live Births  0            Home Medications    Prior to Admission medications   Medication Sig Start Date End Date Taking? Authorizing Provider  benzonatate (TESSALON) 100 MG capsule Take 2 capsules (200 mg total) by mouth every 8 (eight) hours. 07/14/22  Yes Becky Augusta, NP  ipratropium (ATROVENT) 0.06 %  nasal spray Place 2 sprays into both nostrils 4 (four) times daily. 07/14/22  Yes Becky Augusta, NP  promethazine-dextromethorphan (PROMETHAZINE-DM) 6.25-15 MG/5ML syrup Take 5 mLs by mouth 4 (four) times daily as needed. 07/14/22  Yes Becky Augusta, NP    Family History Family History  Problem Relation Age of Onset   Heart disease Mother 26       triple bypass   Diabetes Mother    Hypertension Mother    Thyroid disease Mother    Other Father        unknown medical history   Cancer Maternal Grandmother        unsure what type   Diabetes Paternal Grandmother    Breast cancer Neg Hx    Ovarian cancer Neg Hx    Colon cancer Neg Hx     Social History Social History   Tobacco Use   Smoking status: Never   Smokeless tobacco: Never  Vaping Use   Vaping Use: Never used  Substance Use Topics   Alcohol use: Not Currently    Comment: once a year   Drug use: No     Allergies   Amoxicillin and Lexapro [escitalopram]   Review of Systems Review  of Systems  Constitutional:  Negative for fever.  HENT:  Positive for congestion, ear pain, rhinorrhea and sore throat.   Respiratory:  Positive for cough. Negative for shortness of breath and wheezing.   Hematological: Negative.   Psychiatric/Behavioral: Negative.       Physical Exam Triage Vital Signs ED Triage Vitals  Enc Vitals Group     BP      Pulse      Resp      Temp      Temp src      SpO2      Weight      Height      Head Circumference      Peak Flow      Pain Score      Pain Loc      Pain Edu?      Excl. in GC?    No data found.  Updated Vital Signs BP 113/76 (BP Location: Left Arm)   Pulse 86   Temp 97.8 F (36.6 C) (Oral)   Resp 16   Ht 5\' 2"  (1.575 m)   Wt 194 lb (88 kg)   LMP 06/23/2022 (Exact Date)   SpO2 100%   BMI 35.48 kg/m   Visual Acuity Right Eye Distance:   Left Eye Distance:   Bilateral Distance:    Right Eye Near:   Left Eye Near:    Bilateral Near:     Physical Exam Vitals  and nursing note reviewed.  Constitutional:      Appearance: Normal appearance. She is not ill-appearing.  HENT:     Head: Normocephalic and atraumatic.     Right Ear: Tympanic membrane, ear canal and external ear normal. There is no impacted cerumen.     Left Ear: Tympanic membrane, ear canal and external ear normal. There is no impacted cerumen.     Nose: Congestion and rhinorrhea present.     Mouth/Throat:     Mouth: Mucous membranes are moist.     Pharynx: Oropharynx is clear. Posterior oropharyngeal erythema present. No oropharyngeal exudate.  Cardiovascular:     Rate and Rhythm: Normal rate and regular rhythm.     Pulses: Normal pulses.     Heart sounds: Normal heart sounds. No murmur heard.    No friction rub. No gallop.  Pulmonary:     Effort: Pulmonary effort is normal.     Breath sounds: Normal breath sounds. No wheezing, rhonchi or rales.  Musculoskeletal:     Cervical back: Normal range of motion and neck supple.  Lymphadenopathy:     Cervical: No cervical adenopathy.  Skin:    General: Skin is warm and dry.     Capillary Refill: Capillary refill takes less than 2 seconds.     Findings: No erythema or rash.  Neurological:     General: No focal deficit present.     Mental Status: She is alert and oriented to person, place, and time.  Psychiatric:        Mood and Affect: Mood normal.        Behavior: Behavior normal.        Thought Content: Thought content normal.        Judgment: Judgment normal.      UC Treatments / Results  Labs (all labs ordered are listed, but only abnormal results are displayed) Labs Reviewed  GROUP A STREP BY PCR  RAPID STREP SCREEN ([facility] ONLY)    EKG   Radiology No results  found.  Procedures Procedures (including critical care time)  Medications Ordered in UC Medications - No data to display  Initial Impression / Assessment and Plan / UC Course  I have reviewed the triage vital signs and the nursing  notes.  Pertinent labs & imaging results that were available during my care of the patient were reviewed by me and considered in my medical decision making (see chart for details).  Patient is a very pleasant, nontoxic-appearing 31 year old female here for evaluation respiratory complaints as outlined in HPI above.  Her physical exam reveals pearly gray tympanic membranes bilaterally with normal light reflex and clear external auditory canals.  Nasal mucosa is markedly edematous and erythematous with clear discharge in both nares.  Oropharyngeal exam reveals surgically absent tonsillar pillars.  Posterior oropharynx is erythematous with mild injection clear postnasal drip.  No cervical lymphadenopathy appreciable exam.  Cardiopulmonary exam reveals S1-S2 heart sounds with regular rate and rhythm and lung sounds and a plus additional fields.  Will check strep PCR.  Strep PCR is negative.  I will discharge patient with a diagnosis of viral URI with cough and treated with Atrovent nasal spray, Tessalon Perles, and Promethazine DM cough syrup.   Final Clinical Impressions(s) / UC Diagnoses   Final diagnoses:  Viral URI with cough     Discharge Instructions      Your strep test today was negative.  I do believe that you are suffering from a viral upper respiratory infection.  Please take the following medications for relief of your symptoms.  Use the Atrovent nasal spray, 2 squirts in each nostril every 6 hours, as needed for runny nose and postnasal drip.  Use the Tessalon Perles every 8 hours during the day.  Take them with a small sip of water.  They may give you some numbness to the base of your tongue or a metallic taste in your mouth, this is normal.  Use the Promethazine DM cough syrup at bedtime for cough and congestion.  It will make you drowsy so do not take it during the day.  Return for reevaluation or see your primary care provider for any new or worsening symptoms.      ED  Prescriptions     Medication Sig Dispense Auth. Provider   benzonatate (TESSALON) 100 MG capsule Take 2 capsules (200 mg total) by mouth every 8 (eight) hours. 21 capsule Becky Augusta, NP   ipratropium (ATROVENT) 0.06 % nasal spray Place 2 sprays into both nostrils 4 (four) times daily. 15 mL Becky Augusta, NP   promethazine-dextromethorphan (PROMETHAZINE-DM) 6.25-15 MG/5ML syrup Take 5 mLs by mouth 4 (four) times daily as needed. 118 mL Becky Augusta, NP      PDMP not reviewed this encounter.   Becky Augusta, NP 07/14/22 [phone]

## 2022-07-14 NOTE — Discharge Instructions (Addendum)
Your strep test today was negative.  I do believe that you are suffering from a viral upper respiratory infection.  Please take the following medications for relief of your symptoms.  Use the Atrovent nasal spray, 2 squirts in each nostril every 6 hours, as needed for runny nose and postnasal drip.  Use the Tessalon Perles every 8 hours during the day.  Take them with a small sip of water.  They may give you some numbness to the base of your tongue or a metallic taste in your mouth, this is normal.  Use the Promethazine DM cough syrup at bedtime for cough and congestion.  It will make you drowsy so do not take it during the day.  Return for reevaluation or see your primary care provider for any new or worsening symptoms.

## 2022-07-14 NOTE — ED Triage Notes (Signed)
Patient having ear pain, nasal congestion, and sore throat. Onset of this past Monday. Patient states it hurts to swallow. Taking Ibuprofen and tylenol but doesn't help.  Patient took an at home COVID test that was negative.   Patient has history of seasonal allergies. Patient has history of reoccurring strep throat. Has had her tonsils removed.

## 2022-08-17 ENCOUNTER — Telehealth (INDEPENDENT_AMBULATORY_CARE_PROVIDER_SITE_OTHER): Payer: 59 | Admitting: Family Medicine

## 2022-08-17 VITALS — Wt 194.0 lb

## 2022-08-17 DIAGNOSIS — F411 Generalized anxiety disorder: Secondary | ICD-10-CM | POA: Diagnosis not present

## 2022-08-17 DIAGNOSIS — F321 Major depressive disorder, single episode, moderate: Secondary | ICD-10-CM | POA: Diagnosis not present

## 2022-08-17 MED ORDER — VENLAFAXINE HCL ER 37.5 MG PO CP24: ORAL_CAPSULE | ORAL | 1 refills | Status: DC

## 2022-08-17 NOTE — Assessment & Plan Note (Signed)
Not at goal and off medication. Start effexor 37.5 >75 mg if tolerating after 1 week. Update in 3 weeks for check in. Return in 6 weeks. Discussed risk for worsening anxiety initially as well as SI.

## 2022-08-17 NOTE — Progress Notes (Signed)
I connected with Karen Sanders on 08/17/22 at  8:00 AM EDT by video and verified that I am speaking with the correct person using two identifiers.   I discussed the limitations, risks, security and privacy concerns of performing an evaluation and management service by video and the availability of in person appointments. I also discussed with the patient that there may be a patient responsible charge related to this service. The patient expressed understanding and agreed to proceed.  Patient location: Home Provider Location: [facility] Participants: Karen Sanders and Karen Sanders   Subjective:     Karen Sanders is a 31 y.o. female presenting for Anxiety (Has tried duloxetine 30mg  and it doesn't work) and Depression     Anxiety    Depression        Past medical history includes anxiety.     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      Social History   Tobacco Use  Smoking Status Never  Smokeless Tobacco Never        Objective:   BP Readings from Last 3 Encounters:  07/14/22 113/76  03/13/22 125/85  12/28/21 122/70   Wt Readings from Last 3 Encounters:  08/17/22 194 lb (88 kg)  07/14/22 194 lb (88 kg)  03/13/22 193 lb 8 oz (87.8 kg)    Wt 194 lb (88 kg)   LMP 07/19/2022 (Exact Date)   BMI 35.48 kg/m   Physical Exam Constitutional:      Appearance: Normal appearance. She is not ill-appearing.  HENT:     Head: Normocephalic and atraumatic.     Right Ear: External ear normal.     Left Ear: External ear normal.  Eyes:     Conjunctiva/sclera: Conjunctivae normal.  Pulmonary:     Effort: Pulmonary effort is normal. No respiratory distress.  Neurological:     Mental  Status: She is alert. Mental status is at baseline.  Psychiatric:        Mood and Affect: Mood normal.        Behavior: Behavior normal.        Thought Content: Thought content normal.        Judgment: Judgment normal.         08/17/2022    7:59 AM 11/02/2021    8:39 AM 10/17/2021    3:38 PM 09/20/2021    8:38 AM  GAD 7 : Generalized Anxiety Score  Nervous, Anxious, on Edge 1 2 3 2   Control/stop worrying 1 3 3 3   Worry too much - different things 1 3 3 3   Trouble relaxing 2 2 3 3   Restless 2 3 2 3   Easily annoyed or irritable 1 2 3 3   Afraid - awful might happen 0 0 0 1  Total GAD 7 Score 8 15 17 18   Anxiety Difficulty Somewhat difficult Somewhat difficult  Somewhat difficult       08/17/2022    8:01 AM 11/02/2021    8:41 AM 10/17/2021    3:38 PM  Depression screen PHQ 2/9  Decreased Interest 1 1 2   Down, Depressed, Hopeless 1 1 1   PHQ - 2 Score 2 2 3   Altered sleeping 3  3 3  Tired, decreased energy 3 1 2   Change in appetite 1 3 3   Feeling bad or failure about yourself  2 1 3   Trouble concentrating 0 1 3  Moving slowly or fidgety/restless 0 0 2  Suicidal thoughts 0 1 0  PHQ-9 Score 11 12 19   Difficult doing work/chores Somewhat difficult Somewhat difficult         Assessment & Plan:   Problem List Items Addressed This Visit       Other   Generalized anxiety disorder - Primary    Not at goal and off medication. Start effexor 37.5 >75 mg if tolerating after 1 week. Update in 3 weeks for check in. Return in 6 weeks. Discussed risk for worsening anxiety initially as well as SI.       Relevant Medications   venlafaxine XR (EFFEXOR XR) 37.5 MG 24 hr capsule   Major depressive disorder, single episode    Not at goal. Unable to do therapy 2/2 to job. Start effexor. Return in 6 weeks.       Relevant Medications   venlafaxine XR (EFFEXOR XR) 37.5 MG 24 hr capsule     Return in about 6 weeks (around 09/28/2022) for TOC with .  ,  MD

## 2022-08-17 NOTE — Patient Instructions (Addendum)
Karen Sanders - appointment   You are going to start a new antidepressant medication.   One of the risks of this medication is increase in suicidal thoughts.   Your suicide Action plan is as follows:  1) Call friend 2) Call the Suicide Hotline [phone] which is available 24 hours 3) Call the Clinic    The most common side effect is stomach upset. If this happens it means the medication is working. It should get better in 1-3 weeks.   Medication for depression and anxiety often takes 6-8 weeks to have a noticeable difference so stick with it. Also the best way for recovery is taking medication and seeing a therapist -- this is so important.    How to help anxiety and depression  1) Regular Exercise - walking, jogging, cycling, dancing, strength training - aiming for 150 minutes of exercise a week --> Yoga has been shown in research to reduce depression and anxiety -- with even just one hour long session per week  2)  Begin a Mindfulness/Meditation practice -- this can take a little as 3 minutes and is helpful for all kinds of mood issues -- You can find resources in books -- Or you can download apps like  ---- Headspace App  ---- Calm  ---- Insignt Timer ---- Stop, Breathe & Think  # With each of these Apps - you should decline the "start free trial" offer and as you search through the App should be able to access some of their free content. You can also chose to pay for the content if you find one that works well for you.   # Many of them also offer sleep specific content which may help with insomnia  3) Healthy Diet -- Avoid or decrease Caffeine -- Avoid or decrease Alcohol -- Drink plenty of water, have a balanced diet -- Avoid cigarettes and marijuana (as well as other recreational drugs)  4) Find a therapist  -- [facility] is one option. Call [phone] -- Or you can check out www.[email or web address] -- you can read bios of therapists and see if they  accept insurance -- Check with your insurance to see if you have coverage and who may take your insurance

## 2022-08-17 NOTE — Assessment & Plan Note (Signed)
Not at goal. Unable to do therapy 2/2 to job. Start effexor. Return in 6 weeks.

## 2022-10-18 ENCOUNTER — Encounter: Payer: Self-pay | Admitting: Certified Nurse Midwife

## 2022-10-30 ENCOUNTER — Ambulatory Visit: Payer: Self-pay | Admitting: Certified Nurse Midwife

## 2023-08-07 ENCOUNTER — Encounter (HOSPITAL_BASED_OUTPATIENT_CLINIC_OR_DEPARTMENT_OTHER): Payer: Self-pay | Admitting: Family Medicine

## 2023-08-07 ENCOUNTER — Other Ambulatory Visit (HOSPITAL_COMMUNITY)
Admission: RE | Admit: 2023-08-07 | Discharge: 2023-08-07 | Disposition: A | Discharge status: Home / Self Care | Payer: BC Managed Care – PPO | Source: Ambulatory Visit

## 2023-08-07 ENCOUNTER — Ambulatory Visit (HOSPITAL_BASED_OUTPATIENT_CLINIC_OR_DEPARTMENT_OTHER): Payer: BC Managed Care – PPO | Admitting: Family Medicine

## 2023-08-07 ENCOUNTER — Ambulatory Visit (INDEPENDENT_AMBULATORY_CARE_PROVIDER_SITE_OTHER): Payer: BC Managed Care – PPO

## 2023-08-07 VITALS — BP 112/77 | HR 68 | Ht 62.0 in | Wt 195.3 lb

## 2023-08-07 VITALS — BP 130/69 | HR 63 | Ht 62.0 in | Wt 195.0 lb

## 2023-08-07 DIAGNOSIS — F411 Generalized anxiety disorder: Secondary | ICD-10-CM

## 2023-08-07 DIAGNOSIS — F331 Major depressive disorder, recurrent, moderate: Secondary | ICD-10-CM | POA: Diagnosis not present

## 2023-08-07 DIAGNOSIS — Z30430 Encounter for insertion of intrauterine contraceptive device: Secondary | ICD-10-CM | POA: Insufficient documentation

## 2023-08-07 DIAGNOSIS — Z7689 Persons encountering health services in other specified circumstances: Secondary | ICD-10-CM | POA: Diagnosis not present

## 2023-08-07 DIAGNOSIS — N760 Acute vaginitis: Secondary | ICD-10-CM

## 2023-08-07 DIAGNOSIS — Z01419 Encounter for gynecological examination (general) (routine) without abnormal findings: Secondary | ICD-10-CM | POA: Insufficient documentation

## 2023-08-07 DIAGNOSIS — Z3202 Encounter for pregnancy test, result negative: Secondary | ICD-10-CM

## 2023-08-07 HISTORY — DX: Encounter for gynecological examination (general) (routine) without abnormal findings: Z01.419

## 2023-08-07 LAB — POCT URINE PREGNANCY: Preg Test, Ur: NEGATIVE

## 2023-08-07 MED ORDER — BUPROPION HCL ER (XL) 150 MG PO TB24: 150.0000 mg | ORAL_TABLET | Freq: Every day | ORAL | 1 refills | Status: DC

## 2023-08-07 MED ORDER — LEVONORGESTREL 20 MCG/DAY IU IUD: 1.0000 | INTRAUTERINE_SYSTEM | Freq: Once | INTRAUTERINE | 0 refills | Status: DC

## 2023-08-07 MED ORDER — LILETTA (52 MG) 20.1 MCG/DAY IU IUD: 1.0000 | INTRAUTERINE_SYSTEM | Freq: Once | INTRAUTERINE | 0 refills | Status: DC

## 2023-08-07 MED ORDER — LEVONORGESTREL 20.1 MCG/DAY IU IUD
1.0000 | INTRAUTERINE_SYSTEM | Freq: Once | INTRAUTERINE | Status: AC
  Administered 2023-08-07: 1 via INTRAUTERINE

## 2023-08-07 NOTE — Patient Instructions (Signed)
 Counseling and Mental Health Resources   [facility]  - For Women and Girls only - Cost based upon sliding scale of income - Financial Aid available  [phone] [address] [place], [place] [address] [facility]  - Mental Health, Substance Abuse Treatment - IV Ketamine, Hydration and Weight Loss Programs - Center for Treatment for Resistant Depression and Suicidal Ideation  [address] [place], [place] [address]  [phone] [email or web address]   [facility]  - Individual and Family Counseling - Assessments and Therapy for Learning Disabilities, ADHD, Autism Spectrum Disorder, Processing Deficits  [phone] [address] [place], [place] [address]  [facility]  [address] [place], [place] [address]  [phone]  [facility]  - Individual, Family, Play and Group Therapy - In person and telehealth sessions available  Phone: [phone] Email: [email or web address]  [place] Location:   [address] [place] [address] [place], [place] [address]   [facility] Location:   [address] [place], [place] [address] [facility]  [address] ([address] [place], [place] [address]  [phone]  [facility], [facility]  Adult, Adolescent and [facility]  [address], [place] [address]  Text:  [phone]   Call:  [phone] Email: [email or web address] Su Ley MA Clinical Psychology  [address] [place] [address]  [phone]  [facility]  - Individual, Group Therapy - Day Programs, Wellness Coaching - Staff Programming, Workshops  [address] [place], [place] [address]  [phone]  [facility] [place]  Grief and Trauma Counseling    [facility]  - Indivudual Counseling, [facility] [address] [place], [place] [address]  [phone] [facility]  - Children, Adolescent, Adult and Family Therapy  [place] Location [phone]  [address] [place], [place] [address]   [place] Location [phone]  [address], [place] [address]    [facility]  Counseling offered by Art therapist Students  - Majority of Patients qualify for financial assistance   [address] [place], [place] [address]  [phone]   [facility]  -Services at "less than a basic fee" sponsored by [facility]  [address] [place], [place] [address]  [phone]

## 2023-08-07 NOTE — Progress Notes (Signed)
Outpatient Gynecology Note: Annual Visit  Assessment/Plan:    Karen Sanders is a 32 y.o. female G0P0000 with normal well-woman gynecologic exam.   Well woman exam - Reviewed health maintenance topics as documented below. - Most recent pap smear in 2021, pap smear collected today..  - STI screening declined.   Encounter for IUD insertion - Reviewed contraceptive options. She has previously used Nexplanon for 5 years but had it removed approximately 2 years ago after having prolonged periods of vaginal bleeding. Would like to now go back on some form of long-acting birth control. After reviewing options, she desires Liletta placement today. See insertion noted below.      Orders Placed This Encounter  Procedures   POCT urine pregnancy   Current Outpatient Medications  Medication Instructions   buPROPion (WELLBUTRIN XL) 150 mg, Oral, Daily   levonorgestrel (LILETTA, 52 MG,) 20.1 MCG/DAY IUD IUD 1 each, Intrauterine,  Once    Return in about 1 year (around 08/06/2024).    Subjective:    Karen Sanders is a 32 y.o. female G0P0000 who presents for annual wellness visit.   Occupation works at [employer], and part-time at [employer] alone    CONCERNS? none  Well Woman Visit:  GYN HISTORY:  Patient's last menstrual period was 07/23/2023 (exact date).     Menstrual History: OB History     Gravida  0   Para  0   Term  0   Preterm  0   AB  0   Living  0      SAB  0   IAB  0   Ectopic  0   Multiple  0   Live Births  0           Menarche age: 32 Patient's last menstrual period was 07/23/2023 (exact date). Period Cycle (Days): 28 Period Duration (Days): 5 Period Pattern: Regular Dysmenorrhea: (!) Mild Dysmenorrhea Symptoms: Cramping, Other (Comment)  Intermenstrual bleeding, spotting, or discharge? no Urinary incontinence? no  Sexually active: yes Number of sexual partners: one Gender of sexual Partners: male Dyspareunia? Yes, always has  had, lower abdominal due to uterus location Last pap: 2021, NILM History of abnormal Pap: no Gardasil series: no STI history: no Contraceptive methods: none, would like to restart contraception today.  Health Maintenance > Reviewed breast self-awareness, no family history > History of abnormal mammogram: n/a > Exercise:  on feet all day at work ,  > Dietary Supplements: [facility] Hair, Skin, and Nails > Body mass index is 35.72 kg/m.  > Recent dental visit Yes.   > Seat Belt Use: Yes.   > Texting and driving? No. > Guns in the house No. > STI/HIV testing or immunizations needed? No. > Concern for alcohol abuse? none   Tobacco or other drug use: denied. Tobacco Use: Low Risk  (08/07/2023)   Patient History    Smoking Tobacco Use: Never    Smokeless Tobacco Use: Never    Passive Exposure: Not on file      _________________________________________________________  Current Outpatient Medications  Medication Sig Dispense Refill   levonorgestrel (LILETTA, 52 MG,) 20.1 MCG/DAY IUD IUD 1 each by Intrauterine route once.     buPROPion (WELLBUTRIN XL) 150 MG 24 hr tablet Take 1 tablet (150 mg total) by mouth daily. (Patient not taking: Reported on 08/07/2023) 30 tablet 1   No current facility-administered medications for this visit.   Allergies  Allergen Reactions   Amoxicillin  Swelling   Lexapro [Escitalopram] Other (See Comments)    Made her sleepy    Past Medical History:  Diagnosis Date   Allergy    Anxiety    Depression    Hypertrophy of tonsils and adenoids    Wears contact lenses    Well woman exam 08/07/2023   Past Surgical History:  Procedure Laterality Date   birth control implant      TONSILLECTOMY AND ADENOIDECTOMY N/A 03/15/2016   Procedure: TONSILLECTOMY AND ADENOIDECTOMY;  Surgeon: Bud Face, MD;  Location: [facility];  Service: ENT;  Laterality: N/A;   WISDOM TOOTH EXTRACTION     OB History     Gravida  0   Para  0    Term  0   Preterm  0   AB  0   Living  0      SAB  0   IAB  0   Ectopic  0   Multiple  0   Live Births  0          Social History   Tobacco Use   Smoking status: Never   Smokeless tobacco: Never  Substance Use Topics   Alcohol use: Not Currently    Comment: once a year   Social History   Substance and Sexual Activity  Sexual Activity Yes   Partners: Male   Birth control/protection: Condom    Immunization History  Administered Date(s) Administered   DTaP 01/16/1994, 04/05/1994, 01/19/1995, 08/15/1995, 04/07/1998   HIB (PRP-OMP) 01/16/1994, 04/05/1994, 01/19/1995   Hepatitis B, PED/ADOLESCENT [birth year] 1993, 01/16/1994, 01/19/1995   Influenza,inj,Quad PF,6+ Mos 08/03/2017, 10/11/2018   MMR 08/15/1995, 04/07/1998   PPD Test 10/11/2018   Polio, Unspecified 01/16/1994, 04/05/1994, 01/19/1995, 04/07/1998   Tdap 08/03/2017   Varicella 04/07/1998, 12/06/2018     Review Of Systems  Constitutional: Denied constitutional symptoms, night sweats, recent illness, fatigue, fever, insomnia and weight loss.  Eyes: Denied eye symptoms, eye pain, photophobia, vision change and visual disturbance.  Ears/Nose/Throat/Neck: Denied ear, nose, throat or neck symptoms, hearing loss, nasal discharge, sinus congestion and sore throat.  Cardiovascular: Denied cardiovascular symptoms, arrhythmia, chest pain/pressure, edema, exercise intolerance, orthopnea and palpitations.  Respiratory: Denied pulmonary symptoms, asthma, pleuritic pain, productive sputum, cough, dyspnea and wheezing.  Gastrointestinal: Denied, gastro-esophageal reflux, melena, nausea and vomiting.  Genitourinary: Denied genitourinary symptoms including symptomatic vaginal discharge, pelvic relaxation issues, and urinary complaints.  Musculoskeletal: Denied musculoskeletal symptoms, stiffness, swelling, muscle weakness and myalgia.  Dermatologic: Denied dermatology symptoms, rash and scar.  Neurologic: Denied neurology  symptoms, dizziness, headache, neck pain and syncope.  Endocrine: Denied endocrine symptoms including hot flashes and night sweats.      Objective:    BP 112/77   Pulse 68   Ht 5\' 2"  (1.575 m)   Wt 195 lb 4.8 oz (88.6 kg)   LMP 07/23/2023 (Exact Date)   BMI 35.72 kg/m   Constitutional: Well-developed, well-nourished female in no acute distress Neurological: Alert and oriented to person, place, and time Psychiatric: Mood and affect appropriate Skin: No rashes or lesions Neck: Supple without masses. Trachea is midline.Thyroid is normal size without masses Lymphatics: No cervical, axillary, supraclavicular, or inguinal adenopathy noted Respiratory: Clear to auscultation bilaterally. Good air movement with normal work of breathing. Cardiovascular: Regular rate and rhythm. Extremities grossly normal, nontender with no edema; pulses regular Gastrointestinal: Soft, nontender, nondistended. No masses or hernias appreciated. No hepatosplenomegaly. No fluid wave. No rebound or guarding. Breast Exam:  deferred with shared decision making Genitourinary:  External Genitalia: Normal female genitalia    Vagina: well-rugated, no lesions.    Cervix: No lesions, normal size and consistency; no cervical motion tenderness     Uterus: Normal size and contour; smooth, mobile, NT, anteverted. Adnexae: Non-palpable and non-tender Perineum/Anus: No lesions Rectal: deferred     ENCOUNTER FOR IUD INSERTION   Subjective  Karen Sanders is a 32 y.o. G0P0000 who presents today for IUD insertion. She desires reversible long-term contraception. We have thoroughly reviewed the risks, benefits, and alternatives, and she has elected to proceed with  Liletta  insertion.   Objective BP 112/77   Pulse 68   Ht 5\' 2"  (1.575 m)   Wt 195 lb 4.8 oz (88.6 kg)   LMP 07/23/2023 (Exact Date)   BMI 35.72 kg/m   UPT: negative  Pelvic exam: normal external genitalia, vulva, vagina, cervix, uterus and  adnexa.   Procedure Note Consent was obtained prior to the procedure. A bimanual exam was performed to determine the position of the uterus. A sterile speculum was placed in the vagina, and the cervix was visualized. Betadine was applied to the cervix. An Lavena Bullion was placed on the anterior lip of the cervix, and gentle traction was applied to straighten and stabilize it. The uterus was sounded to about 6 cm.  An initial attempt at IUD insertion was not successful and the IUD insertion device was found to be dysfunctional. A second IUD device was obtained. The IUD was inserted to the appropriate depth and the insertion tool was removed. The strings were trimmed to about 3 cm. Bleeding was minimal. Patient tolerated the procedure well. Post-procedure care and warning signs were reviewed with patient.  Follow up for annual visit or PRN.  Karen Sanders, CNM   Karen Sanders, CNM  08/07/23 3:12 PM

## 2023-08-07 NOTE — Assessment & Plan Note (Signed)
-   Reviewed contraceptive options. She has previously used Nexplanon for 5 years but had it removed approximately 2 years ago after having prolonged periods of vaginal bleeding. Would like to now go back on some form of long-acting birth control. After reviewing options, she desires Liletta placement today. See insertion noted below.

## 2023-08-07 NOTE — Assessment & Plan Note (Signed)
-   Reviewed health maintenance topics as documented below. - Most recent pap smear in 2021, pap smear collected today..  - STI screening declined.

## 2023-08-07 NOTE — Progress Notes (Signed)
New Patient Office Visit  Subjective:   Karen Sanders May 14, 1991 08/07/2023  Chief Complaint  Patient presents with   New Patient (Initial Visit)    Pt is here today to get established with the practice.     HPI: Karen Sanders presents today to establish care at [facility] at [facility]. Introduced to Publishing rights manager role and practice setting.  All questions answered.   Last PCP: Gar Gibbon Last annual physical: several years  Concerns: See below    ANXIETY and DEPRESSION:  Karen Sanders presents for the medical management of anxiety and depression.  Previously tried Lexapro (made very fatigued) 2.5-5mg  and it still made her feel sleep. She tried Cymbalta without much improvement.  Current medication regimen: None Counseling: Recommended. She is working 11-13 hours a day to keep her mind busy. Finds it hard to get out of bed in the morning and has difficulty with concentrating if sitting still and fidgeting.  Well controlled: Not Currently Denies SI/HI.      08/07/2023    8:41 AM 08/17/2022    7:59 AM 11/02/2021    8:39 AM 10/17/2021    3:38 PM  GAD 7 : Generalized Anxiety Score  Nervous, Anxious, on Edge 2 1 2 3   Control/stop worrying 2 1 3 3   Worry too much - different things 3 1 3 3   Trouble relaxing 3 2 2 3   Restless 1 2 3 2   Easily annoyed or irritable 3 1 2 3   Afraid - awful might happen 1 0 0 0  Total GAD 7 Score 15 8 15 17   Anxiety Difficulty Somewhat difficult Somewhat difficult Somewhat difficult       08/07/2023    8:41 AM 08/17/2022    8:01 AM 11/02/2021    8:41 AM  PHQ9 SCORE ONLY  PHQ-9 Total Score 21 11 12      The following portions of the patient's history were reviewed and updated as appropriate: past medical history, past surgical history, family history, social history, allergies, medications, and problem list.   Patient Active Problem List   Diagnosis Date Noted    Moderate episode of recurrent major depressive disorder (HCC) 08/07/2023   GAD (generalized anxiety disorder) 09/20/2021   Major depressive disorder, single episode 09/20/2021   Obesity (BMI 30.0-34.9) 09/09/2020   Family history of early CAD 11/17/2019   Past Medical History:  Diagnosis Date   Allergy    Anxiety    Depression    Hypertrophy of tonsils and adenoids    Wears contact lenses    Past Surgical History:  Procedure Laterality Date   birth control implant      TONSILLECTOMY AND ADENOIDECTOMY N/A 03/15/2016   Procedure: TONSILLECTOMY AND ADENOIDECTOMY;  Surgeon: Bud Face, MD;  Location: [facility];  Service: ENT;  Laterality: N/A;   WISDOM TOOTH EXTRACTION     Family History  Problem Relation Age of Onset   Heart disease Mother 63       triple bypass   Diabetes Mother    Hypertension Mother    Thyroid disease Mother    Other Father        unknown medical history   Cancer Maternal Grandmother        unsure what type   Diabetes Paternal Grandmother    Anxiety disorder Sister    Breast cancer Neg Hx    Ovarian cancer Neg Hx    Colon cancer Neg Hx  Social History   Socioeconomic History   Marital status: Single    Spouse name: Not on file   Number of children: Not on file   Years of education: Not on file   Highest education level: Associate degree: occupational, Scientist, product/process development, or vocational program  Occupational History   Not on file  Tobacco Use   Smoking status: Never   Smokeless tobacco: Never  Vaping Use   Vaping status: Never Used  Substance and Sexual Activity   Alcohol use: Not Currently    Comment: once a year   Drug use: No   Sexual activity: Yes    Partners: Male    Birth control/protection: Condom  Other Topics Concern   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[employer]      Family: Mom lives nearby - good relationship, has  siblings - OK relationships - 2 older, 1 younger      Enjoys: spend time with god-daughter      Exercise: works 2 jobs   Diet: not good      IT sales professional belts: Yes    Guns: Yes  and secure   Safe in relationships: Yes    Social Determinants of Corporate investment banker Strain: Low Risk  (08/03/2023)   Overall Financial Resource Strain (CARDIA)    Difficulty of Paying Living Expenses: Not hard at all  Food Insecurity: No Food Insecurity (08/03/2023)   Hunger Vital Sign    Worried About Running Out of Food in the Last Year: Never true    Ran Out of Food in the Last Year: Never true  Transportation Needs: No Transportation Needs (08/03/2023)   PRAPARE - Administrator, Civil Service (Medical): No    Lack of Transportation (Non-Medical): No  Physical Activity: Sufficiently Active (08/03/2023)   Exercise Vital Sign    Days of Exercise per Week: 5 days    Minutes of Exercise per Session: 60 min  Stress: Stress Concern Present (08/03/2023)   [employer] of Occupational Health - Occupational Stress Questionnaire    Feeling of Stress : Rather much  Social Connections: Moderately Isolated (08/03/2023)   Social Connection and Isolation Panel [NHANES]    Frequency of Communication with Friends and Family: More than three times a week    Frequency of Social Gatherings with Friends and Family: Twice a week    Attends Religious Services: 1 to 4 times per year    Active Member of [employer] or Organizations: No    Attends Engineer, structural: Not on file    Marital Status: Never married  Intimate Partner Violence: Not on file   Outpatient Medications Prior to Visit  Medication Sig Dispense Refill   ipratropium (ATROVENT) 0.06 % nasal spray Place 2 sprays into both nostrils 4 (four) times daily. 15 mL 12   venlafaxine XR (EFFEXOR XR) 37.5 MG 24 hr capsule Start 37.5 mg tablet daily. After 1 week if tolerating increasing 75 mg 60 capsule 1   No facility-administered  medications prior to visit.   Allergies  Allergen Reactions   Amoxicillin Swelling   Lexapro [Escitalopram] Other (See Comments)    Made her sleepy    ROS: A complete ROS was performed with pertinent positives/negatives noted in the HPI. The remainder of the ROS are negative.   Objective:   Today's Vitals   08/07/23  0838  BP: 130/69  Pulse: 63  SpO2: 100%  Weight: 195 lb (88.5 kg)  Height: 5\' 2"  (1.575 m)    GENERAL: Well-appearing, in NAD. Well nourished.  SKIN: Pink, warm and dry. No rash, lesion, ulceration, or ecchymoses.  Head: Normocephalic. NECK: Trachea midline. Full ROM w/o pain or tenderness. No lymphadenopathy.  RESPIRATORY: Chest wall symmetrical. Respirations even and non-labored. Breath sounds clear to auscultation bilaterally.  CARDIAC: S1, S2 present, regular rate and rhythm without murmur or gallops. Peripheral pulses 2+ bilaterally.  MSK: Muscle tone and strength appropriate for age.  NEUROLOGIC: No motor or sensory deficits. Steady, even gait. C2-C12 intact.  PSYCH/MENTAL STATUS: Alert, oriented x 3. Cooperative, appropriate mood and affect.      Assessment & Plan:  1. GAD (generalized anxiety disorder) 2. Moderate episode of recurrent major depressive disorder [facility]) Safety plan reviewed with patient. Start Welbutrin 150mg  XR daily. Counseling recommended and resources provided with AVS. Will follow up in 4 weeks or sooner for titration and management. Safe use of medication reviewed with patient.   3. Encounter to establish care with new doctor Discussed role of PCP and need for AE and labs. Will complete in 4 weeks with return visit if not done with OBGYN today for her well woman exam.   Patient to reach out to office if new, worrisome, or unresolved symptoms arise or if no improvement in patient's condition. Patient verbalized understanding and is agreeable to treatment plan. All questions answered to patient's satisfaction.    Return in about 4 weeks  (around 09/04/2023) for Follow up Anxiety and Depression.   Of note, portions of this note may have been created with voice recognition software Physicist, medical). While this note has been edited for accuracy, occasional wrong-word or 'sound-a-like' substitutions may have occurred due to the inherent limitations of voice recognition software.  Yolanda Manges, FNP

## 2023-08-14 MED ORDER — FLUCONAZOLE 150 MG PO TABS
150.0000 mg | ORAL_TABLET | Freq: Once | ORAL | 1 refills | Status: AC
Start: 2023-08-14 — End: 2023-08-14

## 2023-08-14 NOTE — Addendum Note (Signed)
Addended by: Autumn Messing on: 08/14/2023 08:12 AM   Modules accepted: Orders

## 2023-08-24 LAB — CYTOLOGY - PAP: Diagnosis: NEGATIVE

## 2023-09-04 ENCOUNTER — Encounter (HOSPITAL_BASED_OUTPATIENT_CLINIC_OR_DEPARTMENT_OTHER): Payer: Self-pay | Admitting: Family Medicine

## 2023-09-04 ENCOUNTER — Ambulatory Visit (HOSPITAL_BASED_OUTPATIENT_CLINIC_OR_DEPARTMENT_OTHER): Payer: BC Managed Care – PPO | Admitting: Family Medicine

## 2023-09-04 VITALS — BP 125/79 | HR 70 | Ht 62.0 in | Wt 193.5 lb

## 2023-09-04 DIAGNOSIS — F411 Generalized anxiety disorder: Secondary | ICD-10-CM | POA: Diagnosis not present

## 2023-09-04 DIAGNOSIS — Z0000 Encounter for general adult medical examination without abnormal findings: Secondary | ICD-10-CM

## 2023-09-04 DIAGNOSIS — F331 Major depressive disorder, recurrent, moderate: Secondary | ICD-10-CM | POA: Diagnosis not present

## 2023-09-04 MED ORDER — BUPROPION HCL ER (XL) 300 MG PO TB24
300.0000 mg | ORAL_TABLET | Freq: Every day | ORAL | 3 refills | Status: DC
Start: 2023-09-04 — End: 2024-09-25

## 2023-09-04 NOTE — Patient Instructions (Signed)

## 2023-09-04 NOTE — Progress Notes (Signed)
Subjective:   Karen Sanders [birth year]  09/04/2023   CC: Chief Complaint  Patient presents with   Anxiety    4-week follow up; states she feels the dose of the medications that she was recently prescribed needs to be upped.    HPI: Karen Sanders is a 32 y.o. female who presents for a routine health maintenance exam.  Labs collected at time of visit.    ANXIETY: [name] presents for the medical management of anxiety.  Current medication regimen: welbutrin 150mg  XR daily.  Counseling: Recommended Well controlled: Improving slightly. Would like to increase dosage to help more with anxiety. Has noticed significant improvement in sleep and depression. She has not had adverse side effects and is very compliant with her medication.  Denies SI/HI.     09/04/2023    8:41 AM 08/07/2023    8:41 AM 08/17/2022    7:59 AM 11/02/2021    8:39 AM  GAD 7 : Generalized Anxiety Score  Nervous, Anxious, on Edge 2 2 1 2   Control/stop worrying 2 2 1 3   Worry too much - different things 2 3 1 3   Trouble relaxing 1 3 2 2   Restless 1 1 2 3   Easily annoyed or irritable 3 3 1 2   Afraid - awful might happen 1 1 0 0  Total GAD 7 Score 12 15 8 15   Anxiety Difficulty Somewhat difficult Somewhat difficult Somewhat difficult Somewhat difficult      09/04/2023    8:41 AM 08/07/2023    8:41 AM 08/17/2022    8:01 AM  PHQ9 SCORE ONLY  PHQ-9 Total Score 12 21 11      HEALTH SCREENINGS: - Vision Screening: up to date - Dental Visits: up to date - Pap smear: up to date - Breast Exam: up to date - STD Screening: Declined - Mammogram (40+): Not applicable  - Colonoscopy (45+): Not applicable  - Bone Density (65+ or under 65 with predisposing conditions): Not applicable  - Lung CA screening with low-dose CT:  Not applicable Adults age 95-80 who are current cigarette smokers or quit within the last 15 years. Must have 20 pack year history.   Depression and Anxiety Screen done today  and results listed below:     09/04/2023    8:41 AM 08/07/2023    8:41 AM 08/17/2022    8:01 AM 11/02/2021    8:41 AM 10/17/2021    3:38 PM  Depression screen PHQ 2/9  Decreased Interest 1 2 1 1 2   Down, Depressed, Hopeless 2 2 1 1 1   PHQ - 2 Score 3 4 2 2 3   Altered sleeping 2 3 3 3 3   Tired, decreased energy 1 2 3 1 2   Change in appetite 1 3 1 3 3   Feeling bad or failure about yourself  2 3 2 1 3   Trouble concentrating 2 3 0 1 3  Moving slowly or fidgety/restless 1 2 0 0 2  Suicidal thoughts 0 1 0 1 0  PHQ-9 Score 12 21 11 12 19   Difficult doing work/chores Somewhat difficult Somewhat difficult Somewhat difficult Somewhat difficult       09/04/2023    8:41 AM 08/07/2023    8:41 AM 08/17/2022    7:59 AM 11/02/2021    8:39 AM  GAD 7 : Generalized Anxiety Score  Nervous, Anxious, on Edge 2 2 1 2   Control/stop worrying 2 2 1 3   Worry too much - different things 2 3  1 3  Trouble relaxing 1 3 2 2   Restless 1 1 2 3   Easily annoyed or irritable 3 3 1 2   Afraid - awful might happen 1 1 0 0  Total GAD 7 Score 12 15 8 15   Anxiety Difficulty Somewhat difficult Somewhat difficult Somewhat difficult Somewhat difficult    IMMUNIZATIONS: - Tdap: Tetanus vaccination status reviewed: last tetanus booster within 10 years. - HPV: Refused - Influenza: Refused - Pneumovax: Not applicable - Prevnar 20: Not applicable - Zostavax (50+): Not applicable   Past medical history, surgical history, medications, allergies, family history and social history reviewed with patient today and changes made to appropriate areas of the chart.   Past Medical History:  Diagnosis Date   Allergy    Anxiety    Depression    Hypertrophy of tonsils and adenoids    Wears contact lenses    Well woman exam 08/07/2023    Past Surgical History:  Procedure Laterality Date   birth control implant      TONSILLECTOMY AND ADENOIDECTOMY N/A 03/15/2016   Procedure: TONSILLECTOMY AND ADENOIDECTOMY;  Surgeon: Bud Face, MD;  Location: [facility];  Service: ENT;  Laterality: N/A;   WISDOM TOOTH EXTRACTION      Current Outpatient Medications on File Prior to Visit  Medication Sig   levonorgestrel (LILETTA, 52 MG,) 20.1 MCG/DAY IUD IUD 1 each by Intrauterine route once.   No current facility-administered medications on file prior to visit.    Allergies  Allergen Reactions   Amoxicillin Swelling   Lexapro [Escitalopram] Other (See Comments)    Made her sleepy     Social History   Socioeconomic History   Marital status: Single    Spouse name: Not on file   Number of children: Not on file   Years of education: Not on file   Highest education level: Associate degree: occupational, Scientist, product/process development, or vocational program  Occupational History   Not on file  Tobacco Use   Smoking status: Never   Smokeless tobacco: Never  Vaping Use   Vaping status: Never Used  Substance and Sexual Activity   Alcohol use: Not Currently    Comment: once a year   Drug use: No   Sexual activity: Yes    Partners: Male    Birth control/protection: Condom, I.U.D.  Other Topics Concern   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[employer]      Family: Mom lives nearby - good relationship, has siblings - OK relationships - 2 older, 1 younger      Enjoys: spend time with god-daughter      Exercise: works 2 jobs   Diet: not good      IT sales professional belts: Yes    Guns: Yes  and secure   Safe in relationships: Yes    Social Determinants of Corporate investment banker Strain: Low Risk  (08/03/2023)   Overall Financial Resource Strain (CARDIA)    Difficulty of Paying Living Expenses: Not hard at all  Food Insecurity: No Food Insecurity (08/03/2023)   Hunger Vital Sign    Worried About Running Out of Food in the Last Year: Never true    Ran Out of Food in the Last Year: Never true  Transportation Needs: No  Transportation Needs (08/03/2023)   PRAPARE - Administrator, Civil Service (Medical):  No    Lack of Transportation (Non-Medical): No  Physical Activity: Sufficiently Active (08/03/2023)   Exercise Vital Sign    Days of Exercise per Week: 5 days    Minutes of Exercise per Session: 60 min  Stress: Stress Concern Present (08/03/2023)   [employer] of Occupational Health - Occupational Stress Questionnaire    Feeling of Stress : Rather much  Social Connections: Moderately Isolated (08/03/2023)   Social Connection and Isolation Panel [NHANES]    Frequency of Communication with Friends and Family: More than three times a week    Frequency of Social Gatherings with Friends and Family: Twice a week    Attends Religious Services: 1 to 4 times per year    Active Member of [employer] or Organizations: No    Attends Engineer, structural: Not on file    Marital Status: Never married  Intimate Partner Violence: Not At Risk (09/04/2023)   Humiliation, Afraid, Rape, and Kick questionnaire    Fear of Current or Ex-Partner: No    Emotionally Abused: No    Physically Abused: No    Sexually Abused: No   Social History   Tobacco Use  Smoking Status Never  Smokeless Tobacco Never   Social History   Substance and Sexual Activity  Alcohol Use Not Currently   Comment: once a year    Family History  Problem Relation Age of Onset   Heart disease Mother 68       triple bypass   Diabetes Mother    Hypertension Mother    Thyroid disease Mother    Other Father        unknown medical history   Cancer Maternal Grandmother        unsure what type   Diabetes Paternal Grandmother    Anxiety disorder Sister    Breast cancer Neg Hx    Ovarian cancer Neg Hx    Colon cancer Neg Hx      ROS: Denies fever, fatigue, unexplained weight loss/gain, chest pain, SHOB, and palpitations. Denies neurological deficits, gastrointestinal or genitourinary complaints, and skin changes.    Objective:   Today's Vitals   09/04/23 0833  BP: 125/79  Pulse: 70  SpO2: 99%  Weight: 193 lb 8 oz (87.8 kg)  Height: 5\' 2"  (1.575 m)    GENERAL APPEARANCE: Well-appearing, in NAD. Well nourished.  SKIN: Pink, warm and dry. Turgor normal. No rash, lesion, ulceration, or ecchymoses. Hair evenly distributed.  HEENT: HEAD: Normocephalic.  EYES: PERRLA. EOMI. Lids intact w/o defect. Sclera white, Conjunctiva pink w/o exudate.  EARS: External ear w/o redness, swelling, masses or lesions. EAC clear. TM's intact, translucent w/o bulging, appropriate landmarks visualized. Appropriate acuity to conversational tones.  NOSE: Septum midline w/o deformity. Nares patent, mucosa pink and non-inflamed w/o drainage. No sinus tenderness.  THROAT: Uvula midline. Oropharynx clear. Tonsils non-inflamed w/o exudate. Oral mucosa pink and moist.  NECK: Supple, Trachea midline. Full ROM w/o pain or tenderness. No lymphadenopathy. Thyroid non-tender w/o enlargement or palpable masses.  RESPIRATORY: Chest wall symmetrical w/o masses. Respirations even and non-labored. Breath sounds clear to auscultation bilaterally. No wheezes, rales, rhonchi, or crackles. CARDIAC: S1, S2 present, regular rate and rhythm. No gallops, murmurs, rubs, or clicks. PMI w/o lifts, heaves, or thrills. No carotid bruits. Capillary refill <2 seconds. Peripheral pulses 2+ bilaterally. GI: Abdomen soft w/o distention. Normoactive bowel sounds. No palpable masses or tenderness. No guarding or rebound tenderness. Liver and spleen w/o tenderness or enlargement. No CVA tenderness.  MSK:  Muscle tone and strength appropriate for age, w/o atrophy or abnormal movement.  EXTREMITIES: Active ROM intact, w/o tenderness, crepitus, or contracture. No obvious joint deformities or effusions. No clubbing, edema, or cyanosis.  NEUROLOGIC: CN's II-XII intact. Motor strength symmetrical with no obvious weakness. No sensory deficits. DTR's 2+ symmetric  bilaterally. Steady, even gait.  PSYCH/MENTAL STATUS: Alert, oriented x 3. Cooperative, appropriate mood and affect.   Chaperoned by Cristy Hilts, CMA   Results for orders placed or performed in visit on 08/07/23  POCT urine pregnancy  Result Value Ref Range   Preg Test, Ur Negative Negative  Cytology - PAP  Result Value Ref Range   Adequacy      Satisfactory for evaluation; transformation zone component PRESENT.   Diagnosis      - Negative for intraepithelial lesion or malignancy (NILM)   Microorganisms      Fungal organisms present consistent with Candida spp.    Assessment & Plan:  1. Annual physical exam Will obtain lab work today for completion of AE and baseline evaluation of health due to family hx of diabetes, thyroid disease and HLD.  - Lipid panel - Comprehensive metabolic panel - CBC with Differential/Platelet - Hemoglobin A1c - TSH  2. GAD (generalized anxiety disorder) 3. Moderate episode of recurrent major depressive disorder (HCC) Improved, but could benefit from increased dosage of Welbutrin for help with anxiety control. Depression is significnatly improved per patient. Will increase Wellbutrin to 300mg  XR daily. Safe use of medication reviewed with patient and she verbalized understanding. Will follow up in 6 months or sooner if needed. Counseling recommended.    Orders Placed This Encounter  Procedures   Lipid panel   Comprehensive metabolic panel   CBC with Differential/Platelet   Hemoglobin A1c   TSH    PATIENT COUNSELING:  - Encouraged a healthy well-balanced diet. Patient may adjust caloric intake to maintain or achieve ideal body weight. May reduce intake of dietary saturated fat and total fat and have adequate dietary potassium and calcium preferably from fresh fruits, vegetables, and low-fat dairy products.   - Advised to avoid cigarette smoking. - Discussed with the patient that most people either abstain from alcohol or drink within safe limits  (<=14/week and <=4 drinks/occasion for males, <=7/weeks and <= 3 drinks/occasion for females) and that the risk for alcohol disorders and other health effects rises proportionally with the number of drinks per week and how often a drinker exceeds daily limits. - Discussed cessation/primary prevention of drug use and availability of treatment for abuse.  - Discussed sexually transmitted diseases, avoidance of unintended pregnancy and contraceptive alternatives.  - Stressed the importance of regular exercise - Injury prevention: Discussed safety belts, safety helmets, smoke detector, smoking near bedding or upholstery.  - Dental health: Discussed importance of regular tooth brushing, flossing, and dental visits.   NEXT PREVENTATIVE PHYSICAL DUE IN 1 YEAR.  Return in about 6 months (around 03/04/2024) for Anxiety and MDD.  Patient to reach out to office if new, worrisome, or unresolved symptoms arise or if no improvement in patient's condition. Patient verbalized understanding and is agreeable to treatment plan. All questions answered to patient's satisfaction.   Of note, portions of this note may have been created with voice recognition software Physicist, medical). While this note has been edited for accuracy, occasional wrong-word or 'sound-a-like' substitutions may have occurred due to the inherent limitations of voice recognition software.  Yolanda Manges, FNP

## 2023-09-05 LAB — COMPREHENSIVE METABOLIC PANEL
ALT: 13 [IU]/L (ref 0–32)
AST: 18 [IU]/L (ref 0–40)
Albumin: 4.4 g/dL (ref 4.0–5.0)
Alkaline Phosphatase: 76 [IU]/L (ref 44–121)
BUN/Creatinine Ratio: 15 (ref 9–23)
BUN: 13 mg/dL (ref 6–20)
Bilirubin Total: 0.3 mg/dL (ref 0.0–1.2)
CO2: 21 mmol/L (ref 20–29)
Calcium: 9.7 mg/dL (ref 8.7–10.2)
Chloride: 103 mmol/L (ref 96–106)
Creatinine, Ser: 0.89 mg/dL (ref 0.57–1.00)
Globulin, Total: 2.2 g/dL (ref 1.5–4.5)
Glucose: 79 mg/dL (ref 70–99)
Potassium: 4.3 mmol/L (ref 3.5–5.2)
Sodium: 139 mmol/L (ref 134–144)
Total Protein: 6.6 g/dL (ref 6.0–8.5)
eGFR: 90 mL/min/{1.73_m2} (ref >59)

## 2023-09-05 LAB — CBC WITH DIFFERENTIAL/PLATELET
Basophils Absolute: 0.1 10*3/uL (ref 0.0–0.2)
Basos: 1 %
EOS (ABSOLUTE): 0.1 10*3/uL (ref 0.0–0.4)
Eos: 2 %
Hematocrit: 45.9 % (ref 34.0–46.6)
Hemoglobin: 14.6 g/dL (ref 11.1–15.9)
Immature Grans (Abs): 0 10*3/uL (ref 0.0–0.1)
Immature Granulocytes: 1 %
Lymphocytes Absolute: 2 10*3/uL (ref 0.7–3.1)
Lymphs: 23 %
MCH: 28.3 pg (ref 26.6–33.0)
MCHC: 31.8 g/dL (ref 31.5–35.7)
MCV: 89 fL (ref 79–97)
Monocytes Absolute: 1 10*3/uL — ABNORMAL HIGH (ref 0.1–0.9)
Monocytes: 12 %
Neutrophils Absolute: 5.3 10*3/uL (ref 1.4–7.0)
Neutrophils: 61 %
Platelets: 272 10*3/uL (ref 150–450)
RBC: 5.16 x10E6/uL (ref 3.77–5.28)
RDW: 12.5 % (ref 11.7–15.4)
WBC: 8.6 10*3/uL (ref 3.4–10.8)

## 2023-09-05 LAB — LIPID PANEL
Chol/HDL Ratio: 3.8 {ratio} (ref 0.0–4.4)
Cholesterol, Total: 196 mg/dL (ref 100–199)
HDL: 52 mg/dL (ref >39)
LDL Chol Calc (NIH): 125 mg/dL — ABNORMAL HIGH (ref 0–99)
Triglycerides: 106 mg/dL (ref 0–149)
VLDL Cholesterol Cal: 19 mg/dL (ref 5–40)

## 2023-09-05 LAB — TSH: TSH: 1.33 u[IU]/mL (ref 0.450–4.500)

## 2023-09-05 LAB — HEMOGLOBIN A1C
Est. average glucose Bld gHb Est-mCnc: 114 mg/dL
Hgb A1c MFr Bld: 5.6 % (ref 4.8–5.6)

## 2023-09-05 NOTE — Progress Notes (Signed)
Hi Karen Sanders,  Your lab work is good overall. Your blood counts, thyroid are normal. Your bad cholesterol (LDL) is slightly elevated. To lower this level, try to avoid greasy/fatty foods. Adhere to a diet with lean proteins, vegetables, fruits, and low carbohydrates. Drink plenty of water. Regular exercise. Your A1c is borderline prediabetic but has been stable since last time it was checked. We will recheck with your next visit. If you have any questions, please call the office.

## 2023-12-30 ENCOUNTER — Encounter (INDEPENDENT_AMBULATORY_CARE_PROVIDER_SITE_OTHER): Payer: Self-pay | Admitting: Family Medicine

## 2023-12-30 DIAGNOSIS — B009 Herpesviral infection, unspecified: Secondary | ICD-10-CM

## 2023-12-31 ENCOUNTER — Encounter (HOSPITAL_BASED_OUTPATIENT_CLINIC_OR_DEPARTMENT_OTHER): Payer: Self-pay | Admitting: Family Medicine

## 2023-12-31 MED ORDER — VALACYCLOVIR HCL 1 G PO TABS: 1000.0000 mg | ORAL_TABLET | Freq: Two times a day (BID) | ORAL | 0 refills | Status: AC

## 2023-12-31 NOTE — Telephone Encounter (Signed)
Jon Gills, please see message sent by pt and advise.

## 2023-12-31 NOTE — Telephone Encounter (Signed)
A total of 5 minutes were spent on this encounter today includingreview of patient's images provided through MyChart,  reviewing patient's previous records, documenting in the record and ordering medications.

## 2024-01-29 ENCOUNTER — Ambulatory Visit
Admission: EM | Admit: 2024-01-29 | Discharge: 2024-01-29 | Disposition: A | Discharge status: Home / Self Care | Payer: BC Managed Care – PPO | Attending: Family Medicine | Admitting: Family Medicine

## 2024-01-29 DIAGNOSIS — J069 Acute upper respiratory infection, unspecified: Secondary | ICD-10-CM | POA: Diagnosis not present

## 2024-01-29 LAB — RESP PANEL BY RT-PCR (FLU A&B, COVID) ARPGX2
Influenza A by PCR: NEGATIVE
Influenza B by PCR: NEGATIVE
SARS Coronavirus 2 by RT PCR: NEGATIVE

## 2024-01-29 LAB — GROUP A STREP BY PCR: Group A Strep by PCR: NOT DETECTED

## 2024-01-29 NOTE — ED Triage Notes (Signed)
 Pt c/o sore throat,bilateral ear pain & chills since this AM.

## 2024-01-29 NOTE — Discharge Instructions (Addendum)
 Strep, COVID and influenza PCR tests were all negative. You have a viral respiratory infection that will gradually improve over the next 7-10 days. Cough may last up to 3 weeks.   If your were prescribed medication, stop by the pharmacy to pick them up.   You can take Tylenol and/or Ibuprofen as needed for fever reduction and pain relief.    For cough: honey 1/2 to 1 teaspoon (you can dilute the honey in water or another fluid). You can also use guaifenesin and dextromethorphan for cough. You can use a humidifier for chest congestion and cough.  If you don't have a humidifier, you can sit in the bathroom with the hot shower running.      For sore throat: try warm salt water gargles, Mucinex sore throat cough drops or cepacol lozenges, throat spray, warm tea or water with lemon/honey, popsicles or ice, or OTC cold relief medicine for throat discomfort. You can also purchase chloraseptic spray at the pharmacy or dollar store.   For congestion: take a daily anti-histamine like Zyrtec, Claritin, and a oral decongestant, such as pseudoephedrine.  You can also use Flonase 1-2 sprays in each nostril daily. Afrin is also a good option, if you do not have high blood pressure.    It is important to stay hydrated: drink plenty of fluids (water, gatorade/powerade/pedialyte, juices, or teas) to keep your throat moisturized and help further relieve irritation/discomfort.    Return or go to the Emergency Department if symptoms worsen or do not improve in the next few days

## 2024-01-29 NOTE — ED Provider Notes (Signed)
 [facility]    CSN: [id number] Arrival date & time: 01/29/24  1554      History   Chief Complaint Chief Complaint  Patient presents with  . Sore Throat  . Otalgia  . Chills    HPI Karen Sanders is a 33 y.o. female.   HPI  History obtained from the patient. [name] presents for sore throat, body aches, chills, rhinorrhea and ear pain that started today around 1230 PM.  Nothing taken for her symptoms. No vomiting or diarrhea. The flu is going around her job.      Past Medical History:  Diagnosis Date  . Allergy   . Anxiety   . Depression   . Hypertrophy of tonsils and adenoids   . Wears contact lenses   . Well woman exam 08/07/2023    Patient Active Problem List   Diagnosis Date Noted  . Moderate episode of recurrent major depressive disorder (HCC) 08/07/2023  . Well woman exam 08/07/2023  . Encounter for IUD insertion 08/07/2023  . GAD (generalized anxiety disorder) 09/20/2021  . Major depressive disorder, single episode 09/20/2021  . Obesity (BMI 30.0-34.9) 09/09/2020  . Family history of early CAD 11/17/2019    Past Surgical History:  Procedure Laterality Date  . birth control implant     . TONSILLECTOMY AND ADENOIDECTOMY N/A 03/15/2016   Procedure: TONSILLECTOMY AND ADENOIDECTOMY;  Surgeon: Bud Face, MD;  Location: [facility];  Service: ENT;  Laterality: N/A;  . WISDOM TOOTH EXTRACTION      OB History     Gravida  0   Para  0   Term  0   Preterm  0   AB  0   Living  0      SAB  0   IAB  0   Ectopic  0   Multiple  0   Live Births  0            Home Medications    Prior to Admission medications   Medication Sig Start Date End Date Taking? Authorizing Provider  buPROPion (WELLBUTRIN XL) 300 MG 24 hr tablet Take 1 tablet (300 mg total) by mouth daily. 09/04/23  Yes Caudle, Shelton Silvas, FNP  levonorgestrel (LILETTA, 52 MG,) 20.1 MCG/DAY IUD IUD 1 each by Intrauterine route once.   Yes  [provider]    Family History Family History  Problem Relation Age of Onset  . Heart disease Mother 59       triple bypass  . Diabetes Mother   . Hypertension Mother   . Thyroid disease Mother   . Other Father        unknown medical history  . Cancer Maternal Grandmother        unsure what type  . Diabetes Paternal Grandmother   . Anxiety disorder Sister   . Breast cancer Neg Hx   . Ovarian cancer Neg Hx   . Colon cancer Neg Hx     Social History Social History   Tobacco Use  . Smoking status: Never  . Smokeless tobacco: Never  Vaping Use  . Vaping status: Never Used  Substance Use Topics  . Alcohol use: Not Currently    Comment: once a year  . Drug use: No     Allergies   Amoxicillin and Lexapro [escitalopram]   Review of Systems Review of Systems: negative unless otherwise stated in HPI.      Physical Exam Triage Vital Signs ED Triage Vitals  Encounter  Vitals Group     BP 01/29/24 1639 131/80     Systolic BP Percentile --      Diastolic BP Percentile --      Pulse Rate 01/29/24 1639 91     Resp 01/29/24 1639 16     Temp 01/29/24 1639 99.7 F (37.6 C)     Temp Source 01/29/24 1639 Oral     SpO2 01/29/24 1639 97 %     Weight 01/29/24 1639 189 lb 14.4 oz (86.1 kg)     Height 01/29/24 1639 5\' 2"  (1.575 m)     Head Circumference --      Peak Flow --      Pain Score 01/29/24 1643 8     Pain Loc --      Pain Education --      Exclude from Growth Chart --    No data found.  Updated Vital Signs BP 131/80 (BP Location: Left Arm)   Pulse 91   Temp 99.7 F (37.6 C) (Oral)   Resp 16   Ht 5\' 2"  (1.575 m)   Wt 86.1 kg   SpO2 97%   BMI 34.73 kg/m   Visual Acuity Right Eye Distance:   Left Eye Distance:   Bilateral Distance:    Right Eye Near:   Left Eye Near:    Bilateral Near:     Physical Exam GEN:     alert, ill but non-toxic appearing female in no distress    HENT:  mucus membranes moist, oropharyngeal without lesions or  erythema, no tonsillar hypertrophy or exudates,  clear nasal discharge, bilateral TM normal EYES:   pupils equal and reactive, no scleral injection or discharge NECK:  normal ROM, no lymphadenopathy, no meningismus   RESP:  no increased work of breathing, clear to auscultation bilaterally CVS:   regular rate and rhythm Skin:   warm and dry, no rash on visible skin    UC Treatments / Results  Labs (all labs ordered are listed, but only abnormal results are displayed) Labs Reviewed  GROUP A STREP BY PCR  RESP PANEL BY RT-PCR (FLU A&B, COVID) [id number]    EKG   Radiology No results found.  Procedures Procedures (including critical care time)  Medications Ordered in UC Medications - No data to display  Initial Impression / Assessment and Plan / UC Course  I have reviewed the triage vital signs and the nursing notes.  Pertinent labs & imaging results that were available during my care of the patient were reviewed by me and considered in my medical decision making (see chart for details).       Pt is a 33 y.o. female who presents for *** days of respiratory symptoms. Jaeden is ***afebrile here without recent antipyretics. Satting well on room air. Overall pt is ***non-toxic appearing, well hydrated, without respiratory distress. Pulmonary exam ***is unremarkable.  COVID and influenza panel obtained ***and was negative. ***Pt to quarantine until COVID test results or longer if positive.  I will call patient with test results, if positive. History consistent with ***viral respiratory illness. Discussed symptomatic treatment.  Explained lack of efficacy of antibiotics in viral disease.  Typical duration of symptoms discussed.   Return and ED precautions given and voiced understanding. Discussed MDM, treatment plan and plan for follow-up with patient*** who agrees with plan.     Final Clinical Impressions(s) / UC Diagnoses   Final diagnoses:  None     Discharge Instructions  Strep negative    ED Prescriptions   None    PDMP not reviewed this encounter.

## 2024-02-12 ENCOUNTER — Encounter (HOSPITAL_BASED_OUTPATIENT_CLINIC_OR_DEPARTMENT_OTHER): Payer: Self-pay | Admitting: Family Medicine

## 2024-02-13 NOTE — Telephone Encounter (Signed)
 Called and spoke with pt letting her know that she needed to either be seen at the office or go back to UC. Was trying to schedule pt tomorrow 3/13 at 11:10 but pt could not make that appt. Pt said she would go back to UC to be seen. Told pt if she weren't any better after going back to UC that we would need to see her at the office and she verbalized understanding. Nothing further needed.

## 2024-03-04 ENCOUNTER — Ambulatory Visit (HOSPITAL_BASED_OUTPATIENT_CLINIC_OR_DEPARTMENT_OTHER): Payer: BC Managed Care – PPO | Admitting: Family Medicine

## 2024-08-25 ENCOUNTER — Encounter: Admitting: Obstetrics and Gynecology

## 2024-08-28 ENCOUNTER — Encounter: Payer: Self-pay | Admitting: Podiatry

## 2024-08-28 ENCOUNTER — Ambulatory Visit: Admitting: Podiatry

## 2024-08-28 DIAGNOSIS — L600 Ingrowing nail: Secondary | ICD-10-CM

## 2024-08-28 NOTE — Progress Notes (Signed)
  Subjective:  Patient ID: Karen Sanders, female    DOB: Dec 23, 1990,  MRN: [id number]  Chief Complaint  Patient presents with   Ingrown Toenail    33 y.o. female presents with the above complaint.  Patient presents with right hallux medial border ingrown painful to touch is progressing and worse worse with ambulation as pressure she would like to have removed has not seen MRIs prior to seeing me pain scale 7 out of 10 dull aching nature.  She is trying to get out of her salve which gave her some relief but is still hurting a lot.   Review of Systems: Negative except as noted in the HPI. Denies N/V/F/Ch.  Past Medical History:  Diagnosis Date   Allergy    Anxiety    Depression    Hypertrophy of tonsils and adenoids    Wears contact lenses    Well woman exam 08/07/2023    Current Outpatient Medications:    buPROPion  (WELLBUTRIN  XL) 300 MG 24 hr tablet, Take 1 tablet (300 mg total) by mouth daily., Disp: 90 tablet, Rfl: 3   levonorgestrel  (LILETTA , 52 MG,) 20.1 MCG/DAY IUD IUD, 1 each by Intrauterine route once., Disp: , Rfl:   Social History   Tobacco Use  Smoking Status Never  Smokeless Tobacco Never    Allergies  Allergen Reactions   Amoxicillin Swelling   Lexapro  [Escitalopram ] Other (See Comments)    Made her sleepy   Objective:  There were no vitals filed for this visit. There is no height or weight on file to calculate BMI. Constitutional Well developed. Well nourished.  Vascular Dorsalis pedis pulses palpable bilaterally. Posterior tibial pulses palpable bilaterally. Capillary refill normal to all digits.  No cyanosis or clubbing noted. Pedal hair growth normal.  Neurologic Normal speech. Oriented to person, place, and time. Epicritic sensation to light touch grossly present bilaterally.  Dermatologic Painful ingrowing nail at medial nail borders of the hallux nail right. No other open wounds. No skin lesions.  Orthopedic: Normal joint ROM without pain  or crepitus bilaterally. No visible deformities. No bony tenderness.   Radiographs: None Assessment:   1. Ingrown toenail of right foot    Plan:  Patient was evaluated and treated and all questions answered.  Ingrown Nail, right -Patient elects to proceed with minor surgery to remove ingrown toenail removal today. Consent reviewed and signed by patient. -Ingrown nail excised. See procedure note. -Educated on post-procedure care including soaking. Written instructions provided and reviewed. -Patient to follow up in 2 weeks for nail check.  Procedure: Excision of Ingrown Toenail Location: Right 1st toe medial nail borders. Anesthesia: Lidocaine  1% plain; 1.5 mL and Marcaine  0.5% plain; 1.5 mL, digital block. Skin Prep: Betadine. Dressing: Silvadene; telfa; dry, sterile, compression dressing. Technique: Following skin prep, the toe was exsanguinated and a tourniquet was secured at the base of the toe. The affected nail border was freed, split with a nail splitter, and excised. Chemical matrixectomy was then performed with phenol and irrigated out with alcohol. The tourniquet was then removed and sterile dressing applied. Disposition: Patient tolerated procedure well. Patient to return in 2 weeks for follow-up.   No follow-ups on file.

## 2024-08-28 NOTE — Patient Instructions (Signed)

## 2024-08-29 NOTE — Progress Notes (Signed)
    GYNECOLOGY OFFICE PROCEDURE NOTE  Karen Sanders is a 33 y.o. G0P0000 here for Liletta  IUD removal. No GYN concerns.  Last pap smear was on 08/07/2023 and was normal.  IUD Removal  Patient identified, informed consent performed, consent signed.  Patient was in the dorsal lithotomy position, normal external genitalia was noted.  A speculum was placed in the patient's vagina, normal discharge was noted, no lesions. The cervix was visualized, no lesions, no abnormal discharge.  The strings of the IUD were grasped and pulled using ring forceps. The IUD was removed in its entirety. Patient tolerated the procedure well.    Patient has plans for pregnancy soon and she was told to avoid teratogens, take PNV and folic acid.  Routine preventative health maintenance measures emphasized.    Damien Parsley, CNM [facility]

## 2024-09-02 ENCOUNTER — Ambulatory Visit: Admitting: Certified Nurse Midwife

## 2024-09-02 VITALS — BP 121/74 | HR 111 | Resp 16 | Ht 62.0 in | Wt 191.2 lb

## 2024-09-02 DIAGNOSIS — Z30432 Encounter for removal of intrauterine contraceptive device: Secondary | ICD-10-CM | POA: Diagnosis not present

## 2024-09-11 ENCOUNTER — Encounter (HOSPITAL_BASED_OUTPATIENT_CLINIC_OR_DEPARTMENT_OTHER): Payer: Self-pay | Admitting: Family Medicine

## 2024-09-25 ENCOUNTER — Ambulatory Visit (HOSPITAL_BASED_OUTPATIENT_CLINIC_OR_DEPARTMENT_OTHER): Admitting: Family Medicine

## 2024-09-25 ENCOUNTER — Encounter (HOSPITAL_BASED_OUTPATIENT_CLINIC_OR_DEPARTMENT_OTHER): Payer: Self-pay | Admitting: Family Medicine

## 2024-09-25 VITALS — BP 122/77 | HR 77 | Ht 62.0 in | Wt 191.0 lb

## 2024-09-25 DIAGNOSIS — F331 Major depressive disorder, recurrent, moderate: Secondary | ICD-10-CM | POA: Diagnosis not present

## 2024-09-25 DIAGNOSIS — F411 Generalized anxiety disorder: Secondary | ICD-10-CM | POA: Diagnosis not present

## 2024-09-25 MED ORDER — BUPROPION HCL ER (XL) 300 MG PO TB24: 300.0000 mg | ORAL_TABLET | Freq: Every day | ORAL | 3 refills | Status: AC

## 2024-09-25 NOTE — Progress Notes (Signed)
 Subjective:   Karen Sanders 1991/11/13 09/25/2024  Chief Complaint  Patient presents with   Medical Management of Chronic Issues    Pt is needing refill of wellbutrin . Denies any main concerns for today's visit.    HPI: Karen Sanders presents today for re-assessment and management of chronic medical conditions.   ANXIETY AND DEPRESSOIN: Doyce LITTIE Karen Sanders presents for the medical management of anxiety and depression. Patient is desiring to conceive in the future and has discussed remaining on her wellbutrin  which her OBGYN has recommended and approved.  Current medication regimen: Wellbutrin  300mg  XR Counseling: Recommended Well controlled: Yes currently per patient.  Denies SI/HI.     09/25/2024    8:08 AM 09/04/2023    8:41 AM 08/07/2023    8:41 AM 08/17/2022    7:59 AM  GAD 7 : Generalized Anxiety Score  Nervous, Anxious, on Edge 2 2 2 1   Control/stop worrying 3 2 2 1   Worry too much - different things 3 2 3 1   Trouble relaxing 2 1 3 2   Restless 2 1 1 2   Easily annoyed or irritable 3 3 3 1   Afraid - awful might happen 1 1 1  0  Total GAD 7 Score 16 12 15 8   Anxiety Difficulty Somewhat difficult Somewhat difficult Somewhat difficult Somewhat difficult      09/25/2024    8:07 AM 09/04/2023    8:41 AM 08/07/2023    8:41 AM 08/17/2022    8:01 AM 11/02/2021    8:41 AM  Depression screen PHQ 2/9  Decreased Interest 1 1 2 1 1   Down, Depressed, Hopeless 2 2 2 1 1   PHQ - 2 Score 3 3 4 2 2   Altered sleeping 3 2 3 3 3   Tired, decreased energy 3 1 2 3 1   Change in appetite 1 1 3 1 3   Feeling bad or failure about yourself  2 2 3 2 1   Trouble concentrating 1 2 3  0 1  Moving slowly or fidgety/restless 0 1 2 0 0  Suicidal thoughts 0 0 1 0 1  PHQ-9 Score 13 12 21 11 12   Difficult doing work/chores Somewhat difficult Somewhat difficult Somewhat difficult Somewhat difficult Somewhat difficult       The following portions of the patient's history were reviewed  and updated as appropriate: past medical history, past surgical history, family history, social history, allergies, medications, and problem list.   Patient Active Problem List   Diagnosis Date Noted   Moderate episode of recurrent major depressive disorder (HCC) 08/07/2023   Well woman exam 08/07/2023   Encounter for IUD insertion 08/07/2023   GAD (generalized anxiety disorder) 09/20/2021   Major depressive disorder, single episode 09/20/2021   Obesity (BMI 30.0-34.9) 09/09/2020   Family history of early CAD 11/17/2019   Past Medical History:  Diagnosis Date   Allergy    Anxiety    Depression    Hypertrophy of tonsils and adenoids    Wears contact lenses    Well woman exam 08/07/2023   Past Surgical History:  Procedure Laterality Date   birth control implant      TONSILLECTOMY AND ADENOIDECTOMY N/A 03/15/2016   Procedure: TONSILLECTOMY AND ADENOIDECTOMY;  Surgeon: Carolee Hunter, MD;  Location: [facility];  Service: ENT;  Laterality: N/A;   WISDOM TOOTH EXTRACTION     Family History  Problem Relation Age of Onset   Heart disease Mother 85       triple bypass  Diabetes Mother    Hypertension Mother    Thyroid disease Mother    Other Father        unknown medical history   Cancer Maternal Grandmother        unsure what type   Diabetes Paternal Grandmother    Anxiety disorder Sister    Breast cancer Neg Hx    Ovarian cancer Neg Hx    Colon cancer Neg Hx    Outpatient Medications Prior to Visit  Medication Sig Dispense Refill   buPROPion  (WELLBUTRIN  XL) 300 MG 24 hr tablet Take 1 tablet (300 mg total) by mouth daily. 90 tablet 3   No facility-administered medications prior to visit.   Allergies  Allergen Reactions   Amoxicillin Swelling   Lexapro  [Escitalopram ] Other (See Comments)    Made her sleepy     ROS: A complete ROS was performed with pertinent positives/negatives noted in the HPI. The remainder of the ROS are negative.    Objective:    Today's Vitals   09/25/24 0804  BP: 122/77  Pulse: 77  SpO2: 100%  Weight: 191 lb (86.6 kg)  Height: 5' 2 (1.575 m)    Physical Exam   GENERAL: Well-appearing, in NAD. Well nourished.  SKIN: Pink, warm and dry.  Head: Normocephalic. NECK: Trachea midline. Full ROM w/o pain or tenderness.  RESPIRATORY: Chest wall symmetrical. Respirations even and non-labored.  MSK: Muscle tone and strength appropriate for age. NEUROLOGIC: No motor or sensory deficits. Steady, even gait. C2-C12 intact.  PSYCH/MENTAL STATUS: Alert, oriented x 3. Cooperative, appropriate mood and affect.       Assessment & Plan:  1. GAD (generalized anxiety disorder) (Primary) 2. Moderate episode of recurrent major depressive disorder (HCC) Controlled currently per patient. Will continue on Wellbutrin  XL daily. Safety plan reviewed by patient.  - buPROPion  (WELLBUTRIN  XL) 300 MG 24 hr tablet; Take 1 tablet (300 mg total) by mouth daily.  Dispense: 90 tablet; Refill: 3  Meds ordered this encounter  Medications   buPROPion  (WELLBUTRIN  XL) 300 MG 24 hr tablet    Sig: Take 1 tablet (300 mg total) by mouth daily.    Dispense:  90 tablet    Refill:  3    Supervising Provider:   DE PERU, RAYMOND J [[id number]]   Lab Orders  No laboratory test(s) ordered today   Return if symptoms worsen or fail to improve.    Patient to reach out to office if new, worrisome, or unresolved symptoms arise or if no improvement in patient's condition. Patient verbalized understanding and is agreeable to treatment plan. All questions answered to patient's satisfaction.    Thersia Schuyler Stark, [place]

## 2024-11-13 ENCOUNTER — Ambulatory Visit

## 2024-11-13 VITALS — BP 132/85 | HR 84 | Ht 62.0 in | Wt 197.4 lb

## 2024-11-13 DIAGNOSIS — Z3491 Encounter for supervision of normal pregnancy, unspecified, first trimester: Secondary | ICD-10-CM

## 2024-11-13 DIAGNOSIS — N912 Amenorrhea, unspecified: Secondary | ICD-10-CM

## 2024-11-13 LAB — POCT URINE PREGNANCY: Preg Test, Ur: POSITIVE — AB

## 2024-11-13 NOTE — Progress Notes (Signed)
° ° °  NURSE VISIT NOTE  Subjective:    Patient ID: Karen Sanders, female    DOB: [birth year], 33 y.o.   MRN: [id number]  HPI  Patient is a 33 y.o. G0P0000 female who presents for evaluation of amenorrhea. She believes she could be pregnant. Pregnancy is desired. Sexual Activity: single partner, contraception: none. Current symptoms also include: tired, positive home pregnancy test. Last period was normal.    Objective:    Ht 5' 2 (1.575 m)   Wt 197 lb 6.4 oz (89.5 kg)   LMP 10/05/2024   BMI 36.10 kg/m   Lab Review  Results for orders placed or performed in visit on 11/13/24  POCT urine pregnancy  Result Value Ref Range   Preg Test, Ur Positive (A) Negative    Assessment:   1. Pregnancy with uncertain dates in first trimester   2. Amenorrhea     Plan:   Pregnancy Test: Positive  Estimated Date of Delivery: None noted. BP Cuff Measurement taken. Cuff Size Adult Encouraged well-balanced diet, plenty of rest when needed, pre-natal vitamins daily and walking for exercise.  Discussed self-help for nausea, avoiding OTC medications until consulting provider or pharmacist, other than Tylenol  as needed, minimal caffeine (1-2 cups daily) and avoiding alcohol.   She will schedule her nurse visit @ 7-[redacted] wks pregnant, u/s for dating @10  wk, and NOB visit at [redacted] wk pregnant.        Burnard LITTIE Ro, CMA

## 2024-11-24 ENCOUNTER — Telehealth (HOSPITAL_BASED_OUTPATIENT_CLINIC_OR_DEPARTMENT_OTHER): Payer: Self-pay

## 2024-11-24 NOTE — Telephone Encounter (Signed)
 Copied from CRM #[id number]. Topic: Clinical - Medical Advice >> Nov 24, 2024 12:10 PM Mia F wrote: Reason for CRM: Pt says she is due to come in fora  physical in January where she was told that she need to fast for her labs but she recently found out she was pregnant and wants to know if she should still do the appt and labs. Please contact pt with a response via My Chart or phone.

## 2024-11-25 ENCOUNTER — Telehealth: Payer: Self-pay

## 2024-11-25 NOTE — Telephone Encounter (Signed)
 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

## 2024-12-01 ENCOUNTER — Telehealth

## 2024-12-01 DIAGNOSIS — Z3401 Encounter for supervision of normal first pregnancy, first trimester: Secondary | ICD-10-CM | POA: Insufficient documentation

## 2024-12-01 DIAGNOSIS — Z3689 Encounter for other specified antenatal screening: Secondary | ICD-10-CM

## 2024-12-01 NOTE — Progress Notes (Signed)
 New OB Intake  I connected with  Karen Sanders on 12/01/2024 at  1:15 PM EST by telephone Video Visit and verified that I am speaking with the correct person using two identifiers. Nurse is located at [facility] and pt is located at work.  I discussed the limitations, risks, security and privacy concerns of performing an evaluation and management service by telephone and the availability of in person appointments. I also discussed with the patient that there may be a patient responsible charge related to this service. The patient expressed understanding and agreed to proceed.  I explained I am completing New OB Intake today. We discussed her EDD of 07/12/25 that is based on LMP of 10/05/24. Pt is G1/P0. I reviewed her allergies, medications, Medical/Surgical/OB history, and appropriate screenings. There are cats in the home: yes. If yes: Indoor. Based on history, this is a/an pregnancy complicated by anxiety/depression . Her obstetrical history is significant for obesity.  Patient Active Problem List   Diagnosis Date Noted   Encounter for supervision of normal first pregnancy in first trimester 12/01/2024   Moderate episode of recurrent major depressive disorder (HCC) 08/07/2023   GAD (generalized anxiety disorder) 09/20/2021   Major depressive disorder, single episode 09/20/2021   Obesity (BMI 30.0-34.9) 09/09/2020   Family history of early CAD 11/17/2019    Concerns addressed today: None  Delivery Plans:  Plans to deliver at [facility]  Anatomy US  Explained first scheduled US  will be 12/18/24. Anatomy US  will be scheduled around [redacted] weeks gestational age.  Labs Discussed genetic screening with patient. Patient desires genetic testing to be drawn at new OB visit. Discussed possible labs to be drawn at new OB appointment.  COVID Vaccine Patient has not had COVID vaccine.   Social Determinants of Health Food Insecurity: denies food insecurity WIC Referral:  Patient is not interested in referral to [facility].  Transportation: Patient denies transportation needs. Childcare: Discussed no children allowed at ultrasound appointments.   First visit review I reviewed new OB appt with pt. I explained she will have blood work and pap smear/pelvic exam if indicated. Explained pt will be seen by Harland Birkenhead, MD at first visit; encounter routed to appropriate provider.   [place], LPN [id number]  4:44 PM

## 2024-12-01 NOTE — Patient Instructions (Signed)
 First Trimester of Pregnancy  The first trimester of pregnancy starts on the first day of your last monthly period until the end of week 13. This is months 1 through 3 of pregnancy. A week after a sperm fertilizes an egg, the egg will implant into the wall of the uterus and begin to develop into a baby. Body changes during your first trimester Your body goes through many changes during pregnancy. The changes usually return to normal after your baby is born. Physical changes Your breasts may grow larger and may hurt. The area around your nipples may get darker. Your periods will stop. Your hair and nails may grow faster. You may pee more often. Health changes You may tire easily. Your gums may bleed and may be sensitive when you brush and floss. You may not feel hungry. You may have heartburn. You may throw up or feel like you may throw up. You may want to eat some foods, but not others. You may have headaches. You may have trouble pooping (constipation). Other changes Your emotions may change from day to day. You may have more dreams. Follow these instructions at home: Medicines Talk to your health care provider if you're taking medicines. Ask if the medicines are safe to take during pregnancy. Your provider may change the medicines that you take. Do not take any medicines unless told to by your provider. Take a prenatal vitamin that has at least 600 micrograms (mcg) of folic acid. Do not use herbal medicines, illegal substances, or medicines that are not approved by your provider. Eating and drinking While you're pregnant your body needs extra food for your growing baby. Talk with your provider about what to eat while pregnant. Activity Most women are able to exercise during pregnancy. Exercises may need to change as your pregnancy goes on. Talk to your provider about your activities and exercise routines. Relieving pain and discomfort Wear a good, supportive bra if your breasts  hurt. Rest with your legs raised if you have leg cramps or low back pain. Safety Wear your seatbelt at all times when you're in a car. Talk to your provider if someone hits you, hurts you, or yells at you. Talk with your provider if you're feeling sad or have thoughts of hurting yourself. Lifestyle Certain things can be harmful while you're pregnant. Follow these rules: Do not use hot tubs, steam rooms, or saunas. Do not douche. Do not use tampons or scented pads. Do not drink alcohol,smoke, vape, or use products with nicotine or tobacco in them. If you need help quitting, talk with your provider. Avoid cat litter boxes and soil used by cats. These things carry germs that can cause harm to your pregnancy and your baby. General instructions Keep all follow-up visits. It helps you and your unborn baby stay as healthy as possible. Write down your questions. Take them to your visits. Your provider will: Talk with you about your overall health. Give you advice or refer you to specialists who can help with different needs, including: Prenatal education classes. Mental health and counseling. Foods and healthy eating. Ask for help if you need help with food. Call your dentist and ask to be seen. Brush your teeth with a soft toothbrush. Floss gently. Where to find more information American Pregnancy Association: americanpregnancy.org [employer]: acog.org Office on [employer] Health: [place] Contact a health care provider if: You feel dizzy, faint, or have a fever. You vomit or have watery poop (diarrhea) for 2  days or more. You have abnormal discharge or bleeding from your vagina. You have pain when you pee or your pee smells bad. You have cramps, pain, or pressure in your belly area. Get help right away if: You have trouble breathing or chest pain. You have any kind of injury, such as from a fall or a car crash. These symptoms may be an  emergency. Get help right away. Call 911. Do not wait to see if the symptoms will go away. Do not drive yourself to the hospital. This information is not intended to replace advice given to you by your health care provider. Make sure you discuss any questions you have with your health care provider. Document Revised: 08/23/2023 Document Reviewed: 03/23/2023 Elsevier Patient Education  2024 [employer] Commonly Asked Questions During Pregnancy  Cats: A parasite can be excreted in cat feces.  To avoid exposure you need to have another person empty the little box.  If you must empty the litter box you will need to wear gloves.  Wash your hands after handling your cat.  This parasite can also be found in raw or undercooked meat so this should also be avoided.  Colds, Sore Throats, Flu: Please check your medication sheet to see what you can take for symptoms.  If your symptoms are unrelieved by these medications please call the office.  Dental Work: Most any dental work agricultural consultant recommends is permitted.  X-rays should only be taken during the first trimester if absolutely necessary.  Your abdomen should be shielded with a lead apron during all x-rays.  Please notify your provider prior to receiving any x-rays.  Novocaine is fine; gas is not recommended.  If your dentist requires a note from us  prior to dental work please call the office and we will provide one for you.  Exercise: Exercise is an important part of staying healthy during your pregnancy.  You may continue most exercises you were accustomed to prior to pregnancy.  Later in your pregnancy you will most likely notice you have difficulty with activities requiring balance like riding a bicycle.  It is important that you listen to your body and avoid activities that put you at a higher risk of falling.  Adequate rest and staying well hydrated are a must!  If you have questions about the safety of specific activities ask your provider.     Exposure to Children with illness: Try to avoid obvious exposure; report any symptoms to us  when noted,  If you have chicken pos, red measles or mumps, you should be immune to these diseases.   Please do not take any vaccines while pregnant unless you have checked with your OB provider.  Fetal Movement: After 28 weeks we recommend you do kick counts twice daily.  Lie or sit down in a calm quiet environment and count your baby movements kicks.  You should feel your baby at least 10 times per hour.  If you have not felt 10 kicks within the first hour get up, walk around and have something sweet to eat or drink then repeat for an additional hour.  If count remains less than 10 per hour notify your provider.  Fumigating: Follow your pest control agent's advice as to how long to stay out of your home.  Ventilate the area well before re-entering.  Hemorrhoids:   Most over-the-counter preparations can be used during pregnancy.  Check your medication to see what is safe to use.  It is important to use  a stool softener or fiber in your diet and to drink lots of liquids.  If hemorrhoids seem to be getting worse please call the office.   Hot Tubs:  Hot tubs Jacuzzis and saunas are not recommended while pregnant.  These increase your internal body temperature and should be avoided.  Intercourse:  Sexual intercourse is safe during pregnancy as long as you are comfortable, unless otherwise advised by your provider.  Spotting may occur after intercourse; report any bright red bleeding that is heavier than spotting.  Labor:  If you know that you are in labor, please go to the hospital.  If you are unsure, please call the office and let us  help you decide what to do.  Lifting, straining, etc:  If your job requires heavy lifting or straining please check with your provider for any limitations.  Generally, you should not lift items heavier than that you can lift simply with your hands and arms (no back  muscles)  Painting:  Paint fumes do not harm your pregnancy, but may make you ill and should be avoided if possible.  Latex or water based paints have less odor than oils.  Use adequate ventilation while painting.  Permanents & Hair Color:  Chemicals in hair dyes are not recommended as they cause increase hair dryness which can increase hair loss during pregnancy.   Highlighting and permanents are allowed.  Dye may be absorbed differently and permanents may not hold as well during pregnancy.  Sunbathing:  Use a sunscreen, as skin burns easily during pregnancy.  Drink plenty of fluids; avoid over heating.  Tanning Beds:  Because their possible side effects are still unknown, tanning beds are not recommended.  Ultrasound Scans:  Routine ultrasounds are performed at approximately 20 weeks.  You will be able to see your baby's general anatomy an if you would like to know the gender this can usually be determined as well.  If it is questionable when you conceived you may also receive an ultrasound early in your pregnancy for dating purposes.  Otherwise ultrasound exams are not routinely performed unless there is a medical necessity.  Although you can request a scan we ask that you pay for it when conducted because insurance does not cover  patient request scans.  Work: If your pregnancy proceeds without complications you may work until your due date, unless your physician or employer advises otherwise.  Round Ligament Pain/Pelvic Discomfort:  Sharp, shooting pains not associated with bleeding are fairly common, usually occurring in the second trimester of pregnancy.  They tend to be worse when standing up or when you remain standing for long periods of time.  These are the result of pressure of certain pelvic ligaments called round ligaments.  Rest, Tylenol and heat seem to be the most effective relief.  As the womb and fetus grow, they rise out of the pelvis and the discomfort improves.  Please  notify the office if your pain seems different than that described.  It may represent a more serious condition.   Common Medications Safe in Pregnancy  Acne:      Constipation:  Benzoyl Peroxide     Colace  Clindamycin      Dulcolax Suppository  Topica Erythromycin     Fibercon  Salicylic Acid      Metamucil         Miralax AVOID:        Senakot   Accutane    Cough:  Retin-A  Cough Drops  Tetracycline      Phenergan w/ Codeine if Rx  Minocycline      Robitussin (Plain & DM)  Antibiotics:     Crabs/Lice:  Ceclor       RID  Cephalosporins    AVOID:  E-Mycins      Kwell  Keflex  Macrobid/Macrodantin   Diarrhea:  Penicillin      Kao-Pectate  Zithromax      Imodium AD         PUSH FLUIDS AVOID:       Cipro     Fever:  Tetracycline      Tylenol (Regular or Extra  Minocycline       Strength)  Levaquin      Extra Strength-Do not          Exceed 8 tabs/24 hrs Caffeine:        200mg /day (equiv. To 1 cup of coffee or  approx. 3 12 oz sodas)         Gas: Cold/Hayfever:       Gas-X  Benadryl      Mylicon  Claritin       Phazyme  **Claritin-D        Chlor-Trimeton    Headaches:  Dimetapp      ASA-Free Excedrin  Drixoral-Non-Drowsy     Cold Compress  Mucinex (Guaifenasin)     Tylenol (Regular or Extra  Sudafed/Sudafed-12 Hour     Strength)  **Sudafed PE Pseudoephedrine   Tylenol Cold & Sinus     Vicks Vapor Rub  Zyrtec  **AVOID if Problems With Blood Pressure         Heartburn: Avoid lying down for at least 1 hour after meals  Aciphex      Maalox     Rash:  Milk of Magnesia     Benadryl    Mylanta       1% Hydrocortisone Cream  Pepcid  Pepcid Complete   Sleep Aids:  Prevacid      Ambien   Prilosec       Benadryl  Rolaids       Chamomile Tea  Tums (Limit 4/day)     Unisom         Tylenol PM         Warm milk-add vanilla or  Hemorrhoids:       Sugar for taste  Anusol/Anusol H.C.  (RX: Analapram 2.5%)  Sugar Substitutes:  Hydrocortisone OTC     Ok in  moderation  Preparation H      Tucks        Vaseline lotion applied to tissue with wiping    Herpes:     Throat:  Acyclovir      Oragel  Famvir  Valtrex     Vaccines:         Flu Shot Leg Cramps:       *Gardasil  Benadryl      Hepatitis A         Hepatitis B Nasal Spray:       Pneumovax  Saline Nasal Spray     Polio Booster         Tetanus Nausea:       Tuberculosis test or PPD  Vitamin B6 25 mg TID   AVOID:    Dramamine      *Gardasil  Emetrol       Live Poliovirus  Ginger Root 250 mg QID    MMR (measles, mumps &  High Complex Carbs @ Bedtime    rebella)  Sea Bands-Accupressure    Varicella (Chickenpox)  Unisom 1/2 tab TID     *No known complications           If received before Pain:         Known pregnancy;   Darvocet       Resume series after  Lortab        Delivery  Percocet    Yeast:   Tramadol      Femstat  Tylenol 3      Gyne-lotrimin  Ultram       Monistat  Vicodin           MISC:         All Sunscreens           Hair Coloring/highlights          Insect Repellant's          (Including DEET)         Mystic Tans  Tests and Screening During Pregnancy Tests and screenings during pregnancy are an important part of your prenatal care. These tests help your health care provider find any problems that might affect your pregnancy. Some tests need to be done for all pregnant people, and some are optional. Most of the tests and screenings do not pose any risks for you or your baby. You may need more testing if a test result shows there is a risk to your health or your baby's health. Tests and screenings done early in pregnancy Some tests and screenings you may have in early pregnancy are: Blood tests, such as: Complete blood count (CBC). Blood typing. Tests to check for diseases that can cause birth defects or can be passed to your baby, such as: German measles (rubella( and chicken pox. Hepatitis B and C. Human Immunodeficiency Virus (HIV). Syphilis. Zika  virus. Pee tests. Blood pressure. Testing for sexually transmitted infections (STIs), such as chlamydia or gonorrhea. Testing for tuberculosis. Ultrasound. Tests and screenings done later in pregnancy Some common tests you can expect to have later in pregnancy include: Rh antibody testing. Pee and blood tests. Glucose screening. This checks your blood sugar. It will show whether you are developing the type of diabetes that happens during pregnancy, called gestational diabetes. You may have this screening earlier if you have risk factors for diabetes. Ultrasound. This may be repeated at 16-20 weeks to check how your baby is growing. Screening for group B streptococcus (GBS). GBS is a type of bacteria that may live in your rectum or vagina. GBS can spread to your baby during birth. This test is done at 35-37 weeks of pregnancy. Non-stress test. This may be done more often if your pregnancy is high risk. Biophysical profile. This test includes ultrasound imaging and a non-stress test to check to see if your baby is healthy. This test may help decide when your baby should be born. Screening for birth defects Early in your pregnancy, tests can be done to find out if your baby is at risk for a genetic disorder. This testing is optional. The type of testing recommended for you will depend on your family and medical history, your ethnicity, and your age. Testing may include: Screening tests such as ultrasound, blood tests, or a combination of both. Carrier screening. If genetic screening shows that your baby is at risk for a genetic defect, diagnostic testing may be recommended, such as: Amniocentesis. Chorionic villus sampling. Unlike other tests done  during pregnancy, diagnostic testing does have some risk for your pregnancy. Talk to your provider about the risks and benefits of genetic testing. Questions to ask your health care provider What tests are recommended for me? When and how will these  tests be done? When will I get the results of the tests? What do the results of these tests mean for me or my baby? Do you recommend any genetic screening tests? Which ones? Should I see a genetic counselor before having genetic screening? Where to find more information Go to americanpregnancy.org Click on search. Type 'prenatal tests in the search box. Go to [place] Click on search. Type 'prenatal tests in the search box. Go to acog.org Click on search. Type routine tests in the search box. This information is not intended to replace advice given to you by your health care provider. Make sure you discuss any questions you have with your health care provider. Document Revised: 09/18/2023 Document Reviewed: 09/18/2023 Elsevier Patient Education  2025 [employer]. Questions to Ask Your Health Care Provider During Pregnancy  During pregnancy, you'll go through many changes. These will affect your body as well as your feelings and emotions. And you'll likely have a lot of questions. Your health care team is a good source for reliable answers. Make an appointment with your team if you're planning to get pregnant or as soon as you know that you're pregnant. New questions will come up as your pregnancy continues. Write your questions down and take them with you to your prenatal visits. Questions to ask about pregnancy Prenatal visits and tests How often will I have my prenatal visits? Should I visit the dentist during pregnancy? What type of screening tests should I consider? What type of routine tests are suggested and when are they done? What are the risks and benefits of these tests? When would you recommend an ultrasound, and what will it show? Is there a nurse line or office line I can call if I have questions? Caring for yourself during pregnancy How much weight should I gain? What kind of exercise should I get and how much? How much sleep should I get? What kinds of  things can I do to help with stress and anxiety? Are there any travel restrictions? Is it OK to have sex? Eating and drinking during pregnancy What vitamins or supplements do you recommend? When should I start taking my prenatal vitamins? What's a healthy diet for me during pregnancy? What foods should I eat? What foods should I not eat? What if I'm on a special diet? Should I limit how much caffeine I have? Vaccinations What shots should I get? When is the best time to get them? Are there shots I should not get? Medicine and substance use during pregnancy Are my current medicines OK to keep taking? Which medicines could hurt me or my baby? Which supplements or herbal medicines could hurt me or my baby? Why is it important not to smoke or drink alcohol? What about other drugs or substances? Where can I get help if I'm having a hard time quitting? Questions to ask about labor and birth Getting ready What are my birth options? Should I make a birth plan? Where can I have my baby? Is a birth center or home birth an option for me? What are the benefits of breastfeeding? When should I start preparing for breastfeeding? Classes Are there breastfeeding classes or support groups? Where can I find childbirth classes? Pain relief What is natural childbirth?  What can I do to decrease pain during labor and birth? What are other methods to help with pain during labor and birth? Birth What is induced labor? What's an episiotomy, and when might I need one? How can I increase my chance for a vaginal birth? When would a C-section be advised? Can I have a vaginal birth if I had a C-section in the past? Other questions to ask How long will I need to stay in the hospital after giving birth? How soon can I get pregnant after giving birth? What are my birth control options? What are the signs of perinatal depression? What should I do if I have depression or anxiety that's getting worse? This  information is not intended to replace advice given to you by your health care provider. Make sure you discuss any questions you have with your health care provider. Document Revised: 08/14/2023 Document Reviewed: 08/14/2023 Elsevier Patient Education  2024 [employer]. How a Baby Grows During Pregnancy Pregnancy starts when a fertilized egg attaches to the lining of the uterus and begins to grow. It is a time of many changes in the mother's body. The changes happen: To support your pregnancy. To help the baby grow. To prepare for the birth of your baby. How long does a pregnancy last? A pregnancy usually lasts 280 days, or about 40 weeks. Pregnancy is divided into three periods of growth, also called trimesters: First trimester: weeks 0-13. Second trimester: weeks 14-27. Third trimester: weeks 28-40. The end of the 40 weeks of pregnancy is your likely date of delivery, or due date. However, most babies are not born on their due date. How does my baby develop month by month?  First and second month The brain, spinal cord, and heart begin to develop. By 6 weeks, the heart begins to beat. The face, arms, and legs begin to form. Then the hands and feet begin to develop. All major organs begin to develop by the end of the second month. Third month All of the internal organs are forming. Bones and muscles are beginning to grow. The fetus is making movements similar to breathing. Fingernails and toenails are forming. Fourth month The skin is thin and transparent. The neck, outer ear, eyelids, and fingernails are formed. The external sex organs are formed. The fetus can hear, swallow, and move easily. The kidneys begin to make pee (urine). Fifth month The fetus moves around more and can be felt for the first time (quickening). The face, nose, and lips can be seen easily on ultrasound. The baby is covered with soft hair called lanugo. The organs in the digestive system work. Sixth  month The lungs continue to grow and mature. The eyes open. The brain continues to develop. The fetus may begin to suck a finger. Skin ridges are formed that will be fingerprints and toe prints. Hair grows thicker and eyebrows can be seen. Seventh month Lungs are fully developed but not yet ready for birth. Eyes are developed enough to sense changes in light. The fetus responds to sound. The fetus kicks and stretches. Hands can make a grasping motion. Vernix, a waxy coating, is starting to develop to protect the skin. Eighth month Most organs and body systems are fully developed and working. Bones harden, and taste buds develop. The fetus may hiccup. The brain is still developing. The skull remains soft. By week 31, most development is complete and the fetus is gaining weight fast. By the end of week 32, the fetus weighs a  little more than 4 pounds (1.8 kg). Ninth month until your due date The lungs are fully developed and ready for birth. Patterns of sleep develop. The fetus weighs around 6 pounds at the beginning of the ninth month and may weigh around 8 pounds by your due date. The fetus's head typically moves into a head-down position in the uterus. Closer to your due date, the fetus's head may drop lower in your hips. How do I know if my baby is developing well? Always talk with your health care provider about any concerns that you may have about your pregnancy and your baby. At prenatal visits, your provider will do tests to check on your health and keep track of your baby's growth. These include: Fundal height and position. To do this, your provider will: Measure your growing belly from your pubic bone to the top of the uterus using a tape measure. Feel your belly to determine your baby's position. Heartbeat. An ultrasound in the first trimester can confirm pregnancy and show a heartbeat, depending on how far along you are. Your provider will check your baby's heart rate at  prenatal visits. You may also have a second trimester ultrasound to check your baby's development. Follow these instructions at home: Take your medicines as told. Take prenatal vitamins as told by your provider. These include vitamins such as folic acid, iron, calcium, and vitamin D. They are important for healthy development of your baby. Keep all follow-up visits. These include prenatal care and screening tests to check your health and your baby's health. This information is not intended to replace advice given to you by your health care provider. Make sure you discuss any questions you have with your health care provider. Document Revised: 04/23/2023 Document Reviewed: 04/23/2023 Elsevier Patient Education  2024 [employer].

## 2024-12-02 ENCOUNTER — Ambulatory Visit
Admission: EM | Admit: 2024-12-02 | Discharge: 2024-12-02 | Disposition: A | Discharge status: Home / Self Care | Attending: Emergency Medicine | Admitting: Emergency Medicine

## 2024-12-02 ENCOUNTER — Encounter: Payer: Self-pay | Admitting: Emergency Medicine

## 2024-12-02 DIAGNOSIS — J101 Influenza due to other identified influenza virus with other respiratory manifestations: Secondary | ICD-10-CM

## 2024-12-02 LAB — POCT INFLUENZA A/B
Influenza A, POC: POSITIVE — AB
Influenza B, POC: NEGATIVE

## 2024-12-02 LAB — POC SOFIA SARS ANTIGEN FIA: SARS Coronavirus 2 Ag: NEGATIVE

## 2024-12-02 MED ORDER — FLUTICASONE PROPIONATE 50 MCG/ACT NA SUSP: 2.0000 | Freq: Every day | NASAL | 0 refills | Status: DC

## 2024-12-02 MED ORDER — OSELTAMIVIR PHOSPHATE 75 MG PO CAPS: 75.0000 mg | ORAL_CAPSULE | Freq: Two times a day (BID) | ORAL | 0 refills | Status: DC

## 2024-12-02 NOTE — ED Triage Notes (Signed)
 Pt c/o cough, congestion, sore throat, right ear pain and fatigue x 2 days. Pt has been exposed to the flu. She also reports that she is [redacted] weeks pregnant.

## 2024-12-02 NOTE — Discharge Instructions (Signed)
 Stay at home and mask around others until you have been without a fever without the use of Tylenol  for 24 hours.  Good hand hygiene will prevent the spread as well.  Finish Tamiflu, even if you feel better.  1000 g of Tylenol  3 or 4 times a day as needed for pain, fever.  Flonase , saline nasal irrigation with a NeilMed sinus rinse with distilled water as often as you want, plain Mucinex at standard over-the-counter dose this will help for nasal congestion and cough.

## 2024-12-02 NOTE — ED Provider Notes (Signed)
 " HPI  SUBJECTIVE:  Karen Sanders is a G1, P0 33 y.o. female who is currently 8-week 3/7 days pregnant presenting with 2 days of nasal congestion, green rhinorrhea, cough productive of green sputum headache and a sore throat.  She is unable to sleep at night because of the cough.  Her stepson tested positive for influenza last week.  No body aches, sinus pain or pressure, postnasal drip, wheezing, shortness of breath, chest pain, nausea vomiting, diarrhea, abdominal pain.  No pelvic pain, vaginal bleeding, lower abdominal cramping.  She tried 1000 g of Tylenol  within the past 6 hours with improvement in her headache.  No aggravating factors.  She did not get the flu vaccine. She is status post tonsillectomy/adenoidectomy.  She has no other medical history.  She has gotten prenatal care.  She is getting her first ultrasound on January 15.  OB/GYN: [facility].  PCP: [facility]  Past Medical History:  Diagnosis Date   Allergy    Anxiety    Depression    Hypertrophy of tonsils and adenoids    Wears contact lenses    Well woman exam 08/07/2023    Past Surgical History:  Procedure Laterality Date   birth control implant      TONSILLECTOMY AND ADENOIDECTOMY N/A 03/15/2016   Procedure: TONSILLECTOMY AND ADENOIDECTOMY;  Surgeon: Carolee Hunter, MD;  Location: [facility];  Service: ENT;  Laterality: N/A;   WISDOM TOOTH EXTRACTION      Family History  Problem Relation Age of Onset   Hyperlipidemia Mother    Heart disease Mother 11       triple bypass   Diabetes Mother    Hypertension Mother    Thyroid disease Mother    Other Father        unknown medical history   Anxiety disorder Sister    Leukemia Maternal Grandmother 64 - 79       Fell/dx/passed 2 wks later   Diabetes Paternal Grandmother    Breast cancer Neg Hx    Ovarian cancer Neg Hx    Colon cancer Neg Hx     Social History[1]  Current Medications[2]  Allergies[3]   ROS  As noted in  HPI.   Physical Exam  BP 128/77 (BP Location: Left Arm)   Pulse 85   Temp 99.5 F (37.5 C) (Oral)   Resp 17   Wt 88.9 kg   LMP 10/05/2024 (Exact Date)   SpO2 100%   BMI 35.85 kg/m   Constitutional: Well developed, well nourished, no acute distress Eyes: PERRL, EOMI, conjunctiva normal bilaterally HENT: Normocephalic, atraumatic,mucus membranes moist.  Positive nasal congestion.  Slightly erythematous oropharynx.  Tonsils surgically absent. Neck: Positive cervical lymphadenopathy Respiratory: Clear to auscultation bilaterally, no rales, no wheezing, no rhonchi Cardiovascular: Normal rate and rhythm, no murmurs, no gallops, no rubs GI: nondistended skin: No rash, skin intact Musculoskeletal: no deformities Neurologic: Alert & oriented x 3, CN III-XII grossly intact, no motor deficits, sensation grossly intact Psychiatric: Speech and behavior appropriate   ED Course   Medications - No data to display  Orders Placed This Encounter  Procedures   POC Influenza A/B    Standing Status:   Standing    Number of Occurrences:   1   POC SARS Coronavirus Ag    Standing Status:   Standing    Number of Occurrences:   1   Results for orders placed or performed during the hospital encounter of 12/02/24 (from the past  24 hours)  POC Influenza A/B     Status: Abnormal   Collection Time: 12/02/24  6:13 PM  Result Value Ref Range   Influenza A, POC Positive (A) Negative   Influenza B, POC Negative Negative  POC SARS Coronavirus Ag     Status: Normal   Collection Time: 12/02/24  6:13 PM  Result Value Ref Range   SARS Coronavirus 2 Ag Negative Negative   No results found.  ED Clinical Impression  1. Influenza A      ED Assessment/Plan      Influenza A positive.  COVID negative.  Discussed with patient while in department.  Home with Tamiflu, Tylenol , Flonase , saline nasal irrigation, plain Mucinex at standard OTC dose.  Follow-up with PCP as needed.  Written ER return  precautions given.  She does not need a work note.  Discussed labs,  MDM, treatment plan, and plan for follow-up with patient Discussed sn/sx that should prompt return to the ED. patient agrees with plan.   Meds ordered this encounter  Medications   oseltamivir (TAMIFLU) 75 MG capsule    Sig: Take 1 capsule (75 mg total) by mouth 2 (two) times daily. X 5 days    Dispense:  10 capsule    Refill:  0   fluticasone  (FLONASE ) 50 MCG/ACT nasal spray    Sig: Place 2 sprays into both nostrils daily.    Dispense:  16 g    Refill:  0      *This clinic note was created using Scientist, clinical (histocompatibility and immunogenetics). Therefore, there may be occasional mistakes despite careful proofreading. ?      [1]  Social History Tobacco Use   Smoking status: Never   Smokeless tobacco: Never  Vaping Use   Vaping status: Never Used  Substance Use Topics   Alcohol use: Not Currently    Comment: once a year   Drug use: No  [2] No current facility-administered medications for this encounter.  Current Outpatient Medications:    buPROPion  (WELLBUTRIN  XL) 300 MG 24 hr tablet, Take 1 tablet (300 mg total) by mouth daily., Disp: 90 tablet, Rfl: 3   fluticasone  (FLONASE ) 50 MCG/ACT nasal spray, Place 2 sprays into both nostrils daily., Disp: 16 g, Rfl: 0   oseltamivir (TAMIFLU) 75 MG capsule, Take 1 capsule (75 mg total) by mouth 2 (two) times daily. X 5 days, Disp: 10 capsule, Rfl: 0   Prenatal Vit-Fe Fumarate-FA (MULTIVITAMIN-PRENATAL) 27-0.8 MG TABS tablet, Take 1 tablet by mouth daily at 12 noon., Disp: , Rfl:  [3]  Allergies Allergen Reactions   Amoxicillin Swelling   Lexapro  [Escitalopram ] Other (See Comments)    Made her sleepy     Van Knee, MD 12/03/24 1417  "

## 2024-12-03 ENCOUNTER — Ambulatory Visit: Payer: Self-pay

## 2024-12-18 ENCOUNTER — Ambulatory Visit

## 2024-12-18 DIAGNOSIS — Z3491 Encounter for supervision of normal pregnancy, unspecified, first trimester: Secondary | ICD-10-CM | POA: Diagnosis not present

## 2024-12-18 DIAGNOSIS — Z3A10 10 weeks gestation of pregnancy: Secondary | ICD-10-CM

## 2024-12-23 ENCOUNTER — Encounter (HOSPITAL_BASED_OUTPATIENT_CLINIC_OR_DEPARTMENT_OTHER): Admitting: Family Medicine

## 2024-12-29 ENCOUNTER — Encounter: Admitting: Obstetrics & Gynecology

## 2025-01-01 ENCOUNTER — Other Ambulatory Visit (HOSPITAL_COMMUNITY)
Admission: RE | Admit: 2025-01-01 | Discharge: 2025-01-01 | Disposition: A | Source: Ambulatory Visit | Attending: Certified Nurse Midwife | Admitting: Certified Nurse Midwife

## 2025-01-01 ENCOUNTER — Ambulatory Visit: Admitting: Certified Nurse Midwife

## 2025-01-01 ENCOUNTER — Encounter: Payer: Self-pay | Admitting: Certified Nurse Midwife

## 2025-01-01 VITALS — BP 117/69 | HR 85 | Wt 192.0 lb

## 2025-01-01 DIAGNOSIS — Z3401 Encounter for supervision of normal first pregnancy, first trimester: Secondary | ICD-10-CM

## 2025-01-01 DIAGNOSIS — Z114 Encounter for screening for human immunodeficiency virus [HIV]: Secondary | ICD-10-CM

## 2025-01-01 DIAGNOSIS — Z1379 Encounter for other screening for genetic and chromosomal anomalies: Secondary | ICD-10-CM

## 2025-01-01 DIAGNOSIS — Z369 Encounter for antenatal screening, unspecified: Secondary | ICD-10-CM

## 2025-01-01 DIAGNOSIS — Z113 Encounter for screening for infections with a predominantly sexual mode of transmission: Secondary | ICD-10-CM

## 2025-01-01 DIAGNOSIS — Z3A12 12 weeks gestation of pregnancy: Secondary | ICD-10-CM | POA: Diagnosis not present

## 2025-01-01 DIAGNOSIS — Z131 Encounter for screening for diabetes mellitus: Secondary | ICD-10-CM

## 2025-01-01 DIAGNOSIS — Z0184 Encounter for antibody response examination: Secondary | ICD-10-CM

## 2025-01-01 DIAGNOSIS — Z1329 Encounter for screening for other suspected endocrine disorder: Secondary | ICD-10-CM

## 2025-01-01 DIAGNOSIS — Z3481 Encounter for supervision of other normal pregnancy, first trimester: Secondary | ICD-10-CM

## 2025-01-01 MED ORDER — ASPIRIN 81 MG PO TBEC: 81.0000 mg | DELAYED_RELEASE_TABLET | Freq: Every day | ORAL | 2 refills | Status: AC

## 2025-01-01 NOTE — Progress Notes (Signed)
 NEW OB HISTORY AND PHYSICAL  SUBJECTIVE:       Karen Sanders is a 34 y.o. G50P0000 female, Patient's last menstrual period was 10/05/2024 (exact date)., Estimated Date of Delivery: 07/12/25, [redacted]w[redacted]d, presents today for establishment of Prenatal Care. She reports feeling well, excited about pregnancy but not yet feeling pregnant.   Social history Partner/Relationship: Ozell, fiance Living situation: fiance, step children every other week-7yo twins Work: [employer] Exercise: active at work Substance use: no T/E/D  Indications for ASA therapy (per uptodate) One of the following: Previous pregnancy with preeclampsia, especially early onset and with an adverse outcome No Multifetal gestation No Chronic hypertension No Type 1 or 2 diabetes mellitus No Chronic kidney disease No Autoimmune disease (antiphospholipid syndrome, systemic lupus erythematosus) No  Two or more of the following: Nulliparity Yes Obesity (body mass index >30 kg/m2) Yes Family history of preeclampsia in mother or sister Yes Age >=35 years No Sociodemographic characteristics (African American race, low socioeconomic level) No Personal risk factors (eg, previous pregnancy with low birth weight or small for gestational age infant, previous adverse pregnancy outcome [eg, stillbirth], interval >10 years between pregnancies) No Daily 81mg  ASA advised.  Gynecologic History Patient's last menstrual period was 10/05/2024 (exact date). Normal Last Pap:     Component Value Date/Time   DIAGPAP  08/07/2023 1316    - Negative for intraepithelial lesion or malignancy (NILM)   DIAGPAP  10/20/2020 1541    - Negative for intraepithelial lesion or malignancy (NILM)   ADEQPAP  08/07/2023 1316    Satisfactory for evaluation; transformation zone component PRESENT.   ADEQPAP  10/20/2020 1541    Satisfactory for evaluation; transformation zone component PRESENT.  SABRA Results were: normal  Obstetric History OB History   Gravida Para Term Preterm AB Living  1 0 0 0 0 0  SAB IAB Ectopic Multiple Live Births  0 0 0 0 0    # Outcome Date GA Lbr Len/2nd Weight Sex Type Anes PTL Lv  1 Current             Past Medical History:  Diagnosis Date   Allergy    Anxiety    Depression    Hypertrophy of tonsils and adenoids    Wears contact lenses    Well woman exam 08/07/2023    Past Surgical History:  Procedure Laterality Date   birth control implant      TONSILLECTOMY AND ADENOIDECTOMY N/A 03/15/2016   Procedure: TONSILLECTOMY AND ADENOIDECTOMY;  Surgeon: Carolee Hunter, MD;  Location: [facility];  Service: ENT;  Laterality: N/A;   WISDOM TOOTH EXTRACTION      Medications Ordered Prior to Encounter[1]  Allergies[2]  Social History   Socioeconomic History   Marital status: Significant Other    Spouse name: Not on file   Number of children: 0   Years of education: Not on file   Highest education level: Associate degree: occupational, scientist, product/process development, or vocational program  Occupational History   Occupation: Set Designer    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 [employer]  Tobacco Use   Smoking status: Never   Smokeless tobacco: Never  Vaping Use   Vaping status: Never Used  Substance and Sexual Activity   Alcohol use: Not Currently    Comment: once a year   Drug use: No   Sexual activity: Yes    Partners: Male    Birth control/protection: Condom  Other Topics Concern   Not on file  Social History Narrative   12/01/24   From:  the area   Living: Majority of time with fiance who has joint custody of 32 yr old boy/girl twins   Work: [employer] in [employer]      Family: Mom lives nearby - good relationship, has siblings - OK relationships - 2 older, 1 younger      Enjoys: spend time with god-daughter      Exercise: regular walking around neighborhood with dogs   Diet: not good      Safety   Seat belts: Yes    Guns: Yes  and secure   Safe in relationships: Yes    Social Drivers of Health    Tobacco Use: Low Risk (01/01/2025)   Patient History    Smoking Tobacco Use: Never    Smokeless Tobacco Use: Never    Passive Exposure: Not on file  Financial Resource Strain: Low Risk (11/27/2024)   Overall Financial Resource Strain (CARDIA)    Difficulty of Paying Living Expenses: Not hard at all  Food Insecurity: No Food Insecurity (11/27/2024)   Epic    Worried About Radiation Protection Practitioner of Food in the Last Year: Never true    Ran Out of Food in the Last Year: Never true  Transportation Needs: No Transportation Needs (11/27/2024)   Epic    Lack of Transportation (Medical): No    Lack of Transportation (Non-Medical): No  Physical Activity: Insufficiently Active (11/27/2024)   Exercise Vital Sign    Days of Exercise per Week: 2 days    Minutes of Exercise per Session: 10 min  Stress: Stress Concern Present (11/27/2024)   [employer] of Occupational Health - Occupational Stress Questionnaire    Feeling of Stress: To some extent  Social Connections: Moderately Integrated (11/27/2024)   Social Connection and Isolation Panel    Frequency of Communication with Friends and Family: More than three times a week    Frequency of Social Gatherings with Friends and Family: Once a week    Attends Religious Services: More than 4 times per year    Active Member of Clubs or Organizations: Yes    Attends Banker Meetings: 1 to 4 times per year    Marital Status: Never married  Intimate Partner Violence: Not At Risk (12/01/2024)   Epic    Fear of Current or Ex-Partner: No    Emotionally Abused: No    Physically Abused: No    Sexually Abused: No  Depression (PHQ2-9): High Risk (09/25/2024)   Depression (PHQ2-9)    PHQ-2 Score: 13  Alcohol Screen: Low Risk (08/03/2023)   Alcohol Screen    Last Alcohol Screening Score (AUDIT): 2  Housing: Low Risk (11/27/2024)   Epic    Unable to Pay for Housing in the Last Year: No    Number of Times Moved in the Last Year: 0    Homeless in  the Last Year: No  Utilities: Not At Risk (11/27/2024)   Epic    Threatened with loss of utilities: No  Health Literacy: Adequate Health Literacy (12/01/2024)   B1300 Health Literacy    Frequency of need for help with medical instructions: Never    Family History  Problem Relation Age of Onset   Hyperlipidemia Mother    Heart disease Mother 80       triple bypass   Diabetes Mother    Hypertension Mother    Thyroid disease Mother    Other Father        unknown medical history   Anxiety disorder Sister  Leukemia Maternal Grandmother 47 - 79       Fell/dx/passed 2 wks later   Diabetes Paternal Grandmother    Breast cancer Neg Hx    Ovarian cancer Neg Hx    Colon cancer Neg Hx     The following portions of the patient's history were reviewed and updated as appropriate: allergies, current medications, past OB history, past medical history, past surgical history, past family history, past social history, and problem list.  Constitutional: Denied constitutional symptoms, night sweats, recent illness, fatigue, fever, insomnia and weight loss.  Eyes: Denied eye symptoms, eye pain, photophobia, vision change and visual disturbance.  Ears/Nose/Throat/Neck: Denied ear, nose, throat or neck symptoms, hearing loss, nasal discharge, sinus congestion and sore throat.  Cardiovascular: Denied cardiovascular symptoms, arrhythmia, chest pain/pressure, edema, exercise intolerance, orthopnea and palpitations.  Respiratory: Denied pulmonary symptoms, asthma, pleuritic pain, productive sputum, cough, dyspnea and wheezing.  Gastrointestinal: Denied gastro-esophageal reflux, melena, nausea and vomiting.  Genitourinary: Denied genitourinary symptoms including symptomatic vaginal discharge, pelvic relaxation issues, and urinary complaints.  Musculoskeletal: Denied musculoskeletal symptoms, stiffness, swelling, muscle weakness and myalgia.  Dermatologic: Denied dermatology symptoms, rash and scar.   Neurologic: Denied neurology symptoms, dizziness, headache, neck pain and syncope.  Psychiatric: Denied psychiatric symptoms, anxiety and depression.  Endocrine: Denied endocrine symptoms including hot flashes and night sweats.     OBJECTIVE: Initial Physical Exam (New OB) BP 117/69   Pulse 85   Wt 192 lb (87.1 kg)   LMP 10/05/2024 (Exact Date)   BMI 35.12 kg/m  Physical Exam Vitals reviewed.  Constitutional:      General: She is not in acute distress.    Appearance: Normal appearance.  HENT:     Head: Normocephalic.  Neck:     Thyroid: No thyroid mass or thyromegaly.  Cardiovascular:     Rate and Rhythm: Normal rate and regular rhythm.     Heart sounds: Normal heart sounds.  Pulmonary:     Effort: Pulmonary effort is normal.     Breath sounds: Normal breath sounds.  Chest:  Breasts:    Tanner Score is 5.     Right: No inverted nipple, mass or tenderness.     Left: No inverted nipple, mass or tenderness.     Comments: Bilateral nipple piercings present Abdominal:     Palpations: Abdomen is soft.     Tenderness: There is no abdominal tenderness.  Genitourinary:    General: Normal vulva.  Musculoskeletal:     Cervical back: Neck supple. No tenderness.  Skin:    General: Skin is warm and dry.  Neurological:     General: No focal deficit present.     Mental Status: She is alert and oriented to person, place, and time.  Psychiatric:        Mood and Affect: Mood and affect normal.        Behavior: Behavior normal. Behavior is cooperative.     Fetal Heart Rate (bpm): 155  ASSESSMENT: Normal pregnancy   PLAN: Routine prenatal care. We discussed an overview of prenatal care and when to call. Reviewed diet, exercise, and weight gain recommendations in pregnancy. Discussed benefits of breastfeeding and lactation resources at [facility]. I reviewed labs and answered all questions.  1. Encounter for supervision of normal first pregnancy in first trimester (Primary) - NOB  Panel; Future - Culture, OB Urine - Monitor Drug Profile 14(MW) - Nicotine screen, urine - Urinalysis, Routine w reflex microscopic - Comprehensive metabolic panel - Hemoglobin A1c - Hgb Fractionation  Cascade - Protein / creatinine ratio, urine - TSH + free T4 - Toxoplasma antibodies- IgG and  IgM - PANORAMA PRENATAL TEST - Cervicovaginal ancillary only - NOB Panel  2. [redacted] weeks gestation of pregnancy  3. Antenatal screening encounter - NOB Panel; Future - Culture, OB Urine - Monitor Drug Profile 14(MW) - Nicotine screen, urine - Urinalysis, Routine w reflex microscopic - Comprehensive metabolic panel - Hemoglobin A1c - Hgb Fractionation Cascade - Protein / creatinine ratio, urine - TSH + free T4 - Toxoplasma antibodies- IgG and  IgM - Cervicovaginal ancillary only - NOB Panel  4. Screening for diabetes mellitus - Hemoglobin A1c  5. Screening for human immunodeficiency virus - NOB Panel; Future - NOB Panel  6. Immunity status testing - NOB Panel; Future - NOB Panel  7. Genetic screening  8. Screen for sexually transmitted diseases - NOB Panel; Future - Cervicovaginal ancillary only - NOB Panel  9. Screening for thyroid disorder - TSH + free T4  10. Encounter for supervision of other normal pregnancy, first trimester   Harlene LITTIE Cisco, CNM      [1]  Current Outpatient Medications on File Prior to Visit  Medication Sig Dispense Refill   buPROPion  (WELLBUTRIN  XL) 300 MG 24 hr tablet Take 1 tablet (300 mg total) by mouth daily. 90 tablet 3   Prenatal Vit-Fe Fumarate-FA (MULTIVITAMIN-PRENATAL) 27-0.8 MG TABS tablet Take 1 tablet by mouth daily at 12 noon.     No current facility-administered medications on file prior to visit.  [2]  Allergies Allergen Reactions   Amoxicillin Swelling   Lexapro  [Escitalopram ] Other (See Comments)    Made her sleepy

## 2025-01-02 LAB — URINALYSIS, ROUTINE W REFLEX MICROSCOPIC
Bilirubin, UA: NEGATIVE
Glucose, UA: NEGATIVE
Ketones, UA: NEGATIVE
Leukocytes,UA: NEGATIVE
Nitrite, UA: NEGATIVE
RBC, UA: NEGATIVE
Specific Gravity, UA: 1.021 (ref 1.005–1.030)
Urobilinogen, Ur: 0.2 mg/dL (ref 0.2–1.0)
pH, UA: 6.5 (ref 5.0–7.5)

## 2025-01-02 LAB — CERVICOVAGINAL ANCILLARY ONLY
Chlamydia: NEGATIVE
Comment: NEGATIVE
Comment: NORMAL
Neisseria Gonorrhea: NEGATIVE

## 2025-01-02 LAB — PROTEIN / CREATININE RATIO, URINE
Creatinine, Urine: 148.6 mg/dL
Protein, Ur: 13.4 mg/dL
Protein/Creat Ratio: 90 mg/g{creat} (ref 0–200)

## 2025-01-02 LAB — MONITOR DRUG PROFILE 14(MW)
Amphetamine Scrn, Ur: NEGATIVE ng/mL
BARBITURATE SCREEN URINE: NEGATIVE ng/mL
BENZODIAZEPINE SCREEN, URINE: NEGATIVE ng/mL
Buprenorphine, Urine: NEGATIVE ng/mL
CANNABINOIDS UR QL SCN: NEGATIVE ng/mL
Cocaine (Metab) Scrn, Ur: NEGATIVE ng/mL
Creatinine(Crt), U: 151.9 mg/dL (ref 20.0–300.0)
Fentanyl, Urine: NEGATIVE pg/mL
Meperidine Screen, Urine: NEGATIVE ng/mL
Methadone Screen, Urine: NEGATIVE ng/mL
OXYCODONE+OXYMORPHONE UR QL SCN: NEGATIVE ng/mL
Opiate Scrn, Ur: NEGATIVE ng/mL
Ph of Urine: 6.1 (ref 4.5–8.9)
Phencyclidine Qn, Ur: NEGATIVE ng/mL
Propoxyphene Scrn, Ur: NEGATIVE ng/mL
SPECIFIC GRAVITY: 1.016
Tramadol Screen, Urine: NEGATIVE ng/mL

## 2025-01-02 LAB — NICOTINE SCREEN, URINE: Cotinine Ql Scrn, Ur: NEGATIVE ng/mL

## 2025-01-03 LAB — CULTURE, OB URINE

## 2025-01-03 LAB — URINE CULTURE, OB REFLEX

## 2025-01-06 LAB — TOXOPLASMA ANTIBODIES- IGG AND  IGM
Toxoplasma Antibody- IgM: <3 [AU]/ml (ref 0.0–7.9)
Toxoplasma IgG Ratio: <3 [IU]/mL (ref 0.0–7.1)

## 2025-01-06 LAB — CBC/D/PLT+RPR+RH+ABO+RUBIGG...
Antibody Screen: NEGATIVE
Basophils Absolute: 0.1 10*3/uL (ref 0.0–0.2)
Basos: 1 %
EOS (ABSOLUTE): 0.1 10*3/uL (ref 0.0–0.4)
Eos: 1 %
HCV Ab: NONREACTIVE
HIV Screen 4th Generation wRfx: NONREACTIVE
Hematocrit: 42.6 % (ref 34.0–46.6)
Hemoglobin: 14 g/dL (ref 11.1–15.9)
Hepatitis B Surface Ag: NEGATIVE
Immature Grans (Abs): 0.1 10*3/uL (ref 0.0–0.1)
Immature Granulocytes: 1 %
Lymphocytes Absolute: 2.2 10*3/uL (ref 0.7–3.1)
Lymphs: 19 %
MCH: 29.1 pg (ref 26.6–33.0)
MCHC: 32.9 g/dL (ref 31.5–35.7)
MCV: 89 fL (ref 79–97)
Monocytes Absolute: 0.8 10*3/uL (ref 0.1–0.9)
Monocytes: 6 %
Neutrophils Absolute: 8.7 10*3/uL — ABNORMAL HIGH (ref 1.4–7.0)
Neutrophils: 72 %
Platelets: 262 10*3/uL (ref 150–450)
RBC: 4.81 x10E6/uL (ref 3.77–5.28)
RDW: 13.1 % (ref 11.7–15.4)
RPR Ser Ql: NONREACTIVE
Rh Factor: NEGATIVE
Rubella Antibodies, IGG: <0.9 {index} — ABNORMAL LOW
Varicella zoster IgG: NONREACTIVE
WBC: 11.8 10*3/uL — ABNORMAL HIGH (ref 3.4–10.8)

## 2025-01-06 LAB — COMPREHENSIVE METABOLIC PANEL WITH GFR
ALT: 34 [IU]/L — ABNORMAL HIGH (ref 0–32)
AST: 27 [IU]/L (ref 0–40)
Albumin: 4.4 g/dL (ref 3.9–4.9)
Alkaline Phosphatase: 59 [IU]/L (ref 41–116)
BUN/Creatinine Ratio: 11 (ref 9–23)
BUN: 7 mg/dL (ref 6–20)
Bilirubin Total: 0.4 mg/dL (ref 0.0–1.2)
CO2: 17 mmol/L — ABNORMAL LOW (ref 20–29)
Calcium: 9.8 mg/dL (ref 8.7–10.2)
Chloride: 103 mmol/L (ref 96–106)
Creatinine, Ser: 0.66 mg/dL (ref 0.57–1.00)
Globulin, Total: 2.4 g/dL (ref 1.5–4.5)
Glucose: 88 mg/dL (ref 70–99)
Potassium: 3.9 mmol/L (ref 3.5–5.2)
Sodium: 138 mmol/L (ref 134–144)
Total Protein: 6.8 g/dL (ref 6.0–8.5)
eGFR: 120 mL/min/{1.73_m2}

## 2025-01-06 LAB — HEMOGLOBIN A1C
Est. average glucose Bld gHb Est-mCnc: 103 mg/dL
Hgb A1c MFr Bld: 5.2 % (ref 4.8–5.6)

## 2025-01-06 LAB — HGB FRACTIONATION CASCADE
Hgb A2: 2.4 % (ref 1.8–3.2)
Hgb A: 97.6 % (ref 96.4–98.8)
Hgb F: 0 % (ref 0.0–2.0)
Hgb S: 0 %

## 2025-01-06 LAB — TSH+FREE T4
Free T4: 1.1 ng/dL (ref 0.82–1.77)
TSH: 1.16 u[IU]/mL (ref 0.450–4.500)

## 2025-01-06 LAB — HCV INTERPRETATION

## 2025-01-07 ENCOUNTER — Ambulatory Visit: Payer: Self-pay | Admitting: Certified Nurse Midwife

## 2025-01-07 DIAGNOSIS — O26899 Other specified pregnancy related conditions, unspecified trimester: Secondary | ICD-10-CM | POA: Insufficient documentation

## 2025-01-07 DIAGNOSIS — Z2839 Other underimmunization status: Secondary | ICD-10-CM | POA: Insufficient documentation

## 2025-01-09 LAB — PANORAMA PRENATAL TEST FULL PANEL:PANORAMA TEST PLUS 5 ADDITIONAL MICRODELETIONS: FETAL FRACTION: 4.5

## 2025-01-29 ENCOUNTER — Encounter: Admitting: Licensed Practical Nurse
